# Patient Record
Sex: Male | Born: 1945
Health system: Southern US, Community
[De-identification: ages and names within clinical notes are randomized; demographics above are authoritative.]

## PROBLEM LIST (undated history)

## (undated) DIAGNOSIS — E039 Hypothyroidism, unspecified: Secondary | ICD-10-CM

## (undated) DIAGNOSIS — E785 Hyperlipidemia, unspecified: Secondary | ICD-10-CM

## (undated) DIAGNOSIS — K4020 Bilateral inguinal hernia, without obstruction or gangrene, not specified as recurrent: Secondary | ICD-10-CM

## (undated) DIAGNOSIS — M1990 Unspecified osteoarthritis, unspecified site: Secondary | ICD-10-CM

## (undated) HISTORY — PX: FRACTURE SURGERY: SHX138

## (undated) HISTORY — DX: Bilateral inguinal hernia, without obstruction or gangrene, not specified as recurrent: K40.20

## (undated) HISTORY — PX: OTHER SURGICAL HISTORY: SHX169

## (undated) HISTORY — PX: BACK SURGERY: SHX140

## (undated) HISTORY — DX: Unspecified osteoarthritis, unspecified site: M19.90

## (undated) HISTORY — PX: SPINE SURGERY: SHX786

## (undated) HISTORY — PX: WRIST FRACTURE SURGERY: SHX121

## (undated) HISTORY — DX: Hypothyroidism, unspecified: E03.9

## (undated) HISTORY — DX: Hyperlipidemia, unspecified: E78.5

---

## 2000-10-18 HISTORY — PX: INGUINAL HERNIA REPAIR: SUR1180

## 2009-01-02 ENCOUNTER — Ambulatory Visit (HOSPITAL_COMMUNITY): Admission: RE | Admit: 2009-01-02 | Discharge: 2009-01-02 | Payer: Self-pay | Admitting: Internal Medicine

## 2010-07-25 ENCOUNTER — Encounter: Payer: Self-pay | Admitting: Emergency Medicine

## 2010-07-25 ENCOUNTER — Inpatient Hospital Stay (HOSPITAL_COMMUNITY): Admission: EM | Admit: 2010-07-25 | Discharge: 2010-07-31 | Payer: Self-pay

## 2010-07-25 ENCOUNTER — Ambulatory Visit: Payer: Self-pay | Admitting: Thoracic Surgery (Cardiothoracic Vascular Surgery)

## 2010-08-31 ENCOUNTER — Ambulatory Visit: Payer: Self-pay | Admitting: Surgery

## 2010-08-31 ENCOUNTER — Encounter: Admission: RE | Admit: 2010-08-31 | Discharge: 2010-08-31 | Payer: Self-pay | Admitting: Surgery

## 2010-09-09 ENCOUNTER — Encounter (HOSPITAL_COMMUNITY)
Admission: RE | Admit: 2010-09-09 | Discharge: 2010-10-09 | Payer: Self-pay | Source: Home / Self Care | Attending: Orthopedic Surgery | Admitting: Orthopedic Surgery

## 2010-10-01 ENCOUNTER — Encounter (HOSPITAL_COMMUNITY)
Admission: RE | Admit: 2010-10-01 | Discharge: 2010-10-31 | Payer: Self-pay | Source: Home / Self Care | Attending: Orthopedic Surgery | Admitting: Orthopedic Surgery

## 2010-10-09 ENCOUNTER — Encounter
Admission: RE | Admit: 2010-10-09 | Discharge: 2010-11-08 | Payer: Self-pay | Source: Home / Self Care | Attending: Orthopedic Surgery | Admitting: Orthopedic Surgery

## 2010-11-02 ENCOUNTER — Encounter (HOSPITAL_COMMUNITY)
Admission: RE | Admit: 2010-11-02 | Discharge: 2010-11-17 | Payer: Self-pay | Source: Home / Self Care | Attending: Orthopedic Surgery | Admitting: Orthopedic Surgery

## 2010-11-09 ENCOUNTER — Encounter
Admission: RE | Admit: 2010-11-09 | Discharge: 2010-11-17 | Payer: Self-pay | Source: Home / Self Care | Attending: Orthopedic Surgery | Admitting: Orthopedic Surgery

## 2010-11-20 ENCOUNTER — Ambulatory Visit (HOSPITAL_COMMUNITY): Payer: Self-pay | Admitting: Occupational Therapy

## 2010-12-31 LAB — DIFFERENTIAL
Basophils Absolute: 0 10*3/uL (ref 0.0–0.1)
Basophils Relative: 0 % (ref 0–1)
Eosinophils Absolute: 0.1 10*3/uL (ref 0.0–0.7)
Eosinophils Relative: 1 % (ref 0–5)
Lymphocytes Relative: 12 % (ref 12–46)
Lymphs Abs: 1.5 10*3/uL (ref 0.7–4.0)
Monocytes Absolute: 0.6 10*3/uL (ref 0.1–1.0)
Monocytes Relative: 4 % (ref 3–12)
Neutro Abs: 11.2 10*3/uL — ABNORMAL HIGH (ref 1.7–7.7)
Neutrophils Relative %: 84 % — ABNORMAL HIGH (ref 43–77)

## 2010-12-31 LAB — CBC
HCT: 27.2 % — ABNORMAL LOW (ref 39.0–52.0)
HCT: 27.4 % — ABNORMAL LOW (ref 39.0–52.0)
HCT: 28 % — ABNORMAL LOW (ref 39.0–52.0)
HCT: 29.9 % — ABNORMAL LOW (ref 39.0–52.0)
HCT: 39.5 % (ref 39.0–52.0)
Hemoglobin: 13.3 g/dL (ref 13.0–17.0)
Hemoglobin: 8.8 g/dL — ABNORMAL LOW (ref 13.0–17.0)
Hemoglobin: 9.3 g/dL — ABNORMAL LOW (ref 13.0–17.0)
Hemoglobin: 9.9 g/dL — ABNORMAL LOW (ref 13.0–17.0)
MCH: 29.5 pg (ref 26.0–34.0)
MCH: 29.6 pg (ref 26.0–34.0)
MCH: 30.1 pg (ref 26.0–34.0)
MCHC: 32.4 g/dL (ref 30.0–36.0)
MCHC: 33.1 g/dL (ref 30.0–36.0)
MCHC: 33.2 g/dL (ref 30.0–36.0)
MCHC: 33.6 g/dL (ref 30.0–36.0)
MCV: 88.9 fL (ref 78.0–100.0)
MCV: 89.3 fL (ref 78.0–100.0)
MCV: 89.5 fL (ref 78.0–100.0)
Platelets: 108 10*3/uL — ABNORMAL LOW (ref 150–400)
Platelets: 126 10*3/uL — ABNORMAL LOW (ref 150–400)
Platelets: 191 10*3/uL (ref 150–400)
RBC: 3.1 MIL/uL — ABNORMAL LOW (ref 4.22–5.81)
RBC: 3.15 MIL/uL — ABNORMAL LOW (ref 4.22–5.81)
RBC: 3.35 MIL/uL — ABNORMAL LOW (ref 4.22–5.81)
RBC: 4.42 MIL/uL (ref 4.22–5.81)
RDW: 12.9 % (ref 11.5–15.5)
RDW: 13.2 % (ref 11.5–15.5)
RDW: 13.3 % (ref 11.5–15.5)
RDW: 13.4 % (ref 11.5–15.5)
WBC: 13.4 10*3/uL — ABNORMAL HIGH (ref 4.0–10.5)
WBC: 6.1 10*3/uL (ref 4.0–10.5)
WBC: 6.5 10*3/uL (ref 4.0–10.5)
WBC: 8 10*3/uL (ref 4.0–10.5)
WBC: 8.3 10*3/uL (ref 4.0–10.5)

## 2010-12-31 LAB — URINALYSIS, ROUTINE W REFLEX MICROSCOPIC
Bilirubin Urine: NEGATIVE
Glucose, UA: NEGATIVE mg/dL
Leukocytes, UA: NEGATIVE
Nitrite: NEGATIVE
Protein, ur: 100 mg/dL — AB
Specific Gravity, Urine: 1.025 (ref 1.005–1.030)
Urobilinogen, UA: 0.2 mg/dL (ref 0.0–1.0)
pH: 5 (ref 5.0–8.0)

## 2010-12-31 LAB — BASIC METABOLIC PANEL
BUN: 13 mg/dL (ref 6–23)
BUN: 16 mg/dL (ref 6–23)
BUN: 18 mg/dL (ref 6–23)
CO2: 25 mEq/L (ref 19–32)
CO2: 26 mEq/L (ref 19–32)
Calcium: 8.2 mg/dL — ABNORMAL LOW (ref 8.4–10.5)
Calcium: 8.3 mg/dL — ABNORMAL LOW (ref 8.4–10.5)
Calcium: 9.1 mg/dL (ref 8.4–10.5)
Chloride: 110 mEq/L (ref 96–112)
Chloride: 112 mEq/L (ref 96–112)
Creatinine, Ser: 1.07 mg/dL (ref 0.4–1.5)
Creatinine, Ser: 1.32 mg/dL (ref 0.4–1.5)
GFR calc Af Amer: >60 mL/min (ref >60)
GFR calc Af Amer: >60 mL/min (ref >60)
GFR calc Af Amer: >60 mL/min (ref >60)
GFR calc non Af Amer: 55 mL/min — ABNORMAL LOW (ref >60)
GFR calc non Af Amer: >60 mL/min (ref >60)
GFR calc non Af Amer: >60 mL/min (ref >60)
GFR calc non Af Amer: >60 mL/min (ref >60)
Glucose, Bld: 122 mg/dL — ABNORMAL HIGH (ref 70–99)
Glucose, Bld: 150 mg/dL — ABNORMAL HIGH (ref 70–99)
Glucose, Bld: 167 mg/dL — ABNORMAL HIGH (ref 70–99)
Potassium: 3.5 mEq/L (ref 3.5–5.1)
Potassium: 3.5 mEq/L (ref 3.5–5.1)
Potassium: 3.6 mEq/L (ref 3.5–5.1)
Potassium: 4.4 mEq/L (ref 3.5–5.1)
Sodium: 136 mEq/L (ref 135–145)
Sodium: 138 mEq/L (ref 135–145)
Sodium: 141 mEq/L (ref 135–145)
Sodium: 142 mEq/L (ref 135–145)

## 2010-12-31 LAB — URINE MICROSCOPIC-ADD ON

## 2010-12-31 LAB — TYPE AND SCREEN
ABO/RH(D): O POS
Antibody Screen: NEGATIVE

## 2010-12-31 LAB — APTT: aPTT: 28 seconds (ref 24–37)

## 2010-12-31 LAB — MRSA PCR SCREENING: MRSA by PCR: NEGATIVE

## 2010-12-31 LAB — ABO/RH: ABO/RH(D): O POS

## 2010-12-31 LAB — PROTIME-INR
INR: 1.05 (ref 0.00–1.49)
Prothrombin Time: 13.9 seconds (ref 11.6–15.2)

## 2011-03-02 NOTE — Assessment & Plan Note (Signed)
OFFICE VISIT   Brett Gray, Brett Gray  DOB:  [birth year]                                       08/31/2010  UVOZD#:[id number]   The patient comes in today for followup.  He is status post percutaneous  endovascular repair with aortic transection following a trauma.  He had  a CT scan today that shows complete resolution of his mediastinal  hematoma, the stent graft is in good position.  His access site has  healed.  He has palpable pedal pulses.  I will plan on seeing him back  in 1 year with a repeat CT scan.     Jorge Ny, MD  Electronically Signed   VWB/MEDQ  D:  08/31/2010  T:  09/01/2010  Job:  [id number]

## 2011-05-26 ENCOUNTER — Other Ambulatory Visit: Payer: Self-pay | Admitting: Surgery

## 2011-05-26 DIAGNOSIS — I714 Abdominal aortic aneurysm, without rupture: Secondary | ICD-10-CM

## 2011-07-07 ENCOUNTER — Encounter: Payer: Self-pay | Admitting: Surgery

## 2011-08-14 IMAGING — CR DG CHEST 1V PORT
1 series · 1 of 1 positions shown · non-contrast
Comparison: 07/26/2010

CLINICAL DATA: Thoracic aortic injury.  Status post stent graft
repair.

PORTABLE CHEST - 1 VIEW

[view not recorded]
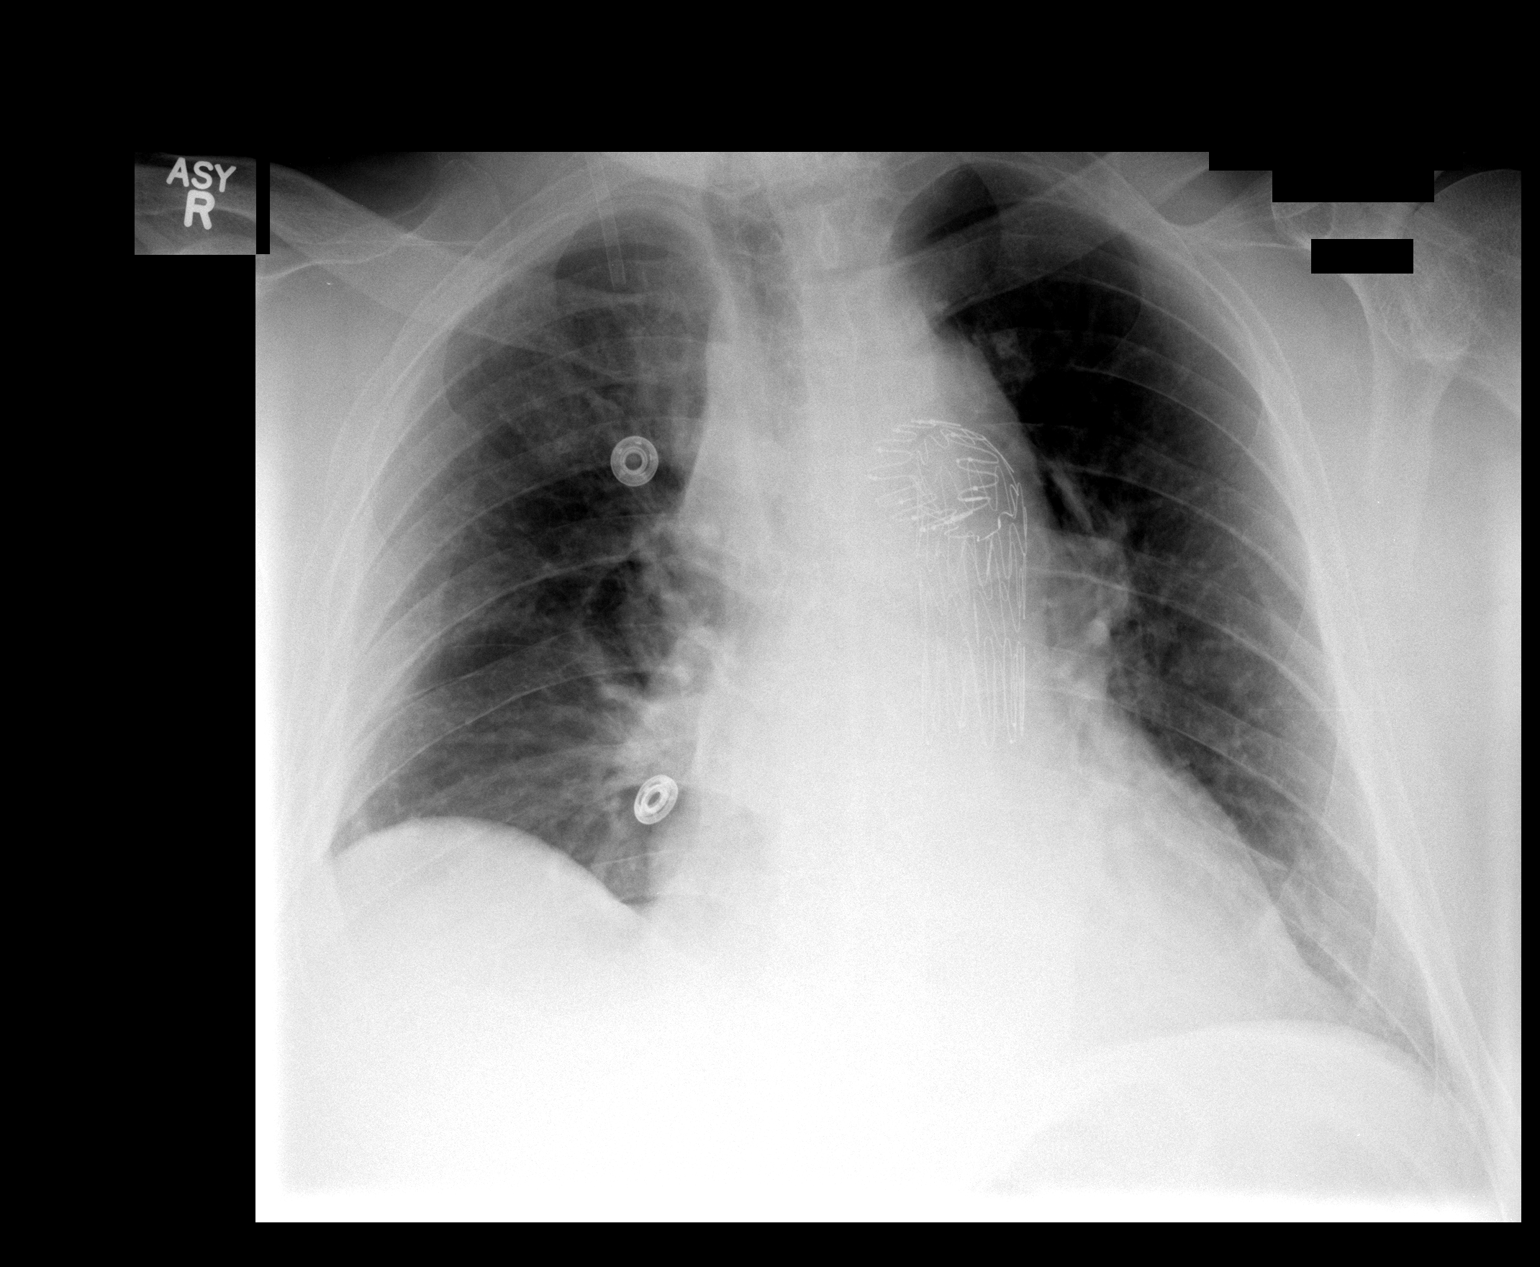

[1 of 1 positions shown; findings below may reference images not displayed]

FINDINGS: The thoracic aortic stent graft is again seen.  Right
jugular Cordis remains in place.

Both lungs are clear.  Mild cardiomegaly is stable.
IMPRESSION: Stable cardiomegaly.  No acute findings.

## 2011-08-27 ENCOUNTER — Encounter: Payer: Self-pay | Admitting: Surgery

## 2011-08-30 ENCOUNTER — Other Ambulatory Visit: Payer: Self-pay | Admitting: Surgery

## 2011-08-30 ENCOUNTER — Ambulatory Visit
Admission: RE | Admit: 2011-08-30 | Discharge: 2011-08-30 | Disposition: A | Discharge status: Home / Self Care | Payer: No Typology Code available for payment source | Source: Ambulatory Visit | Attending: Surgery | Admitting: Surgery

## 2011-08-30 ENCOUNTER — Ambulatory Visit (INDEPENDENT_AMBULATORY_CARE_PROVIDER_SITE_OTHER): Payer: BC Managed Care – PPO | Admitting: Surgery

## 2011-08-30 ENCOUNTER — Encounter: Payer: Self-pay | Admitting: Surgery

## 2011-08-30 VITALS — BP 142/85 | HR 61 | Ht 72.0 in | Wt 222.0 lb

## 2011-08-30 DIAGNOSIS — I714 Abdominal aortic aneurysm, without rupture: Secondary | ICD-10-CM

## 2011-08-30 DIAGNOSIS — I711 Thoracic aortic aneurysm, ruptured, unspecified: Secondary | ICD-10-CM

## 2011-08-30 DIAGNOSIS — I712 Thoracic aortic aneurysm, without rupture, unspecified: Secondary | ICD-10-CM

## 2011-08-30 LAB — CREATININE, SERUM: Creat: 1.1 mg/dL (ref 0.50–1.35)

## 2011-08-30 LAB — BUN: BUN: 18 mg/dL (ref 6–23)

## 2011-08-30 MED ORDER — IOHEXOL 300 MG/ML  SOLN: 100.0000 mL | Freq: Once | INTRAMUSCULAR | Status: AC | PRN

## 2011-08-30 NOTE — Progress Notes (Signed)
[facility] [place]   Patient name: Brett Gray MRN: [id number] DOB: 07/07/1946 Sex: male     Chief Complaint  Patient presents with  . TAA    1 yr f/u    HISTORY OF PRESENT ILLNESS: The patient is back today for followup. He is status post percutaneous endovascular repair of an aortic transection following trauma. The patient had fallen from a ladder and was found to have an aortic transection as well as orthopedic injuries. On October 9 he underwent percutaneous endovascular repair using a [employer] device. He has been doing very well without complaints. He is back to his full functional status.  Past Medical History  Diagnosis Date  . Arthritis   . Hyperlipidemia   . Hernia, inguinal, bilateral     Past Surgical History  Procedure Date  . Aortic catheter     x2  . Back surgery   . Inguinal hernia repair 2002    bilateral    History   Social History  . Marital Status: Married    Spouse Name: N/A    Number of Children: N/A  . Years of Education: N/A   Occupational History  . Not on file.   Social History Main Topics  . Smoking status: Never Smoker   . Smokeless tobacco: Not on file  . Alcohol Use: No  . Drug Use: No  . Sexually Active:    Other Topics Concern  . Not on file   Social History Narrative  . No narrative on file    Family History  Problem Relation Age of Onset  . Other Mother     Carotid artery  . Diabetes Mother   . Cancer Sister     gastric  . Heart disease Brother     Allergies as of 08/30/2011  . (No Known Allergies)    No current outpatient prescriptions on file prior to visit.   No current facility-administered medications on file prior to visit.     REVIEW OF SYSTEMS: Cardiovascular: No chest pain, chest pressure, palpitations, orthopnea, or dyspnea on exertion. No claudication or rest pain,  No history of DVT or phlebitis. Pulmonary: No productive cough, asthma or wheezing. Neurologic:  No weakness, paresthesias, aphasia, or amaurosis. No dizziness. Hematologic: No bleeding problems or clotting disorders. Musculoskeletal: No joint pain or joint swelling. Gastrointestinal: No blood in stool or hematemesis Genitourinary: No dysuria or hematuria. Psychiatric:: No history of major depression. Integumentary: No rashes or ulcers. Constitutional: No fever or chills.  PHYSICAL EXAMINATION:   Vital signs are BP 142/85  Pulse 61  Ht 6' (1.829 m)  Wt 222 lb (100.699 kg)  BMI 30.11 kg/m2  SpO2 98% General: The patient appears their stated age. Pulmonary:  Non labored breathing Abdomen: Soft and non-tender Musculoskeletal: There are no major deformities.  There is no significant extremity pain. Neurologic: No focal weakness or paresthesias are detected, Skin: There are no ulcer or rashes noted. Psychiatric: The patient has normal affect. Cardiovascular: There is a regular rate and rhythm without significant murmur appreciated. Palpable radial pulse bilaterally   Diagnostic Studies I have reviewed his CT angiogram. It has not been formally dictated. I do not see any evidence of endoleak. The stent graft is in good position.  Assessment: Status post endovascular repair of aortic transection Plan: Patient has recovered nicely from his injury I would like to look at this one more time with a CT scan I will do this in 3 years.  Eldridge Abrahams, M.D. [facility] [place] Office: [phone] Pager:  [phone]

## 2012-09-17 ENCOUNTER — Emergency Department (HOSPITAL_COMMUNITY)
Admission: EM | Admit: 2012-09-17 | Discharge: 2012-09-17 | Disposition: A | Discharge status: Home / Self Care | Payer: BC Managed Care – PPO | Attending: Emergency Medicine | Admitting: Emergency Medicine

## 2012-09-17 ENCOUNTER — Encounter (HOSPITAL_COMMUNITY): Payer: Self-pay

## 2012-09-17 ENCOUNTER — Emergency Department (HOSPITAL_COMMUNITY): Payer: BC Managed Care – PPO

## 2012-09-17 DIAGNOSIS — Z79899 Other long term (current) drug therapy: Secondary | ICD-10-CM | POA: Insufficient documentation

## 2012-09-17 DIAGNOSIS — Z7982 Long term (current) use of aspirin: Secondary | ICD-10-CM | POA: Insufficient documentation

## 2012-09-17 DIAGNOSIS — I10 Essential (primary) hypertension: Secondary | ICD-10-CM

## 2012-09-17 DIAGNOSIS — R1013 Epigastric pain: Secondary | ICD-10-CM

## 2012-09-17 DIAGNOSIS — Z8739 Personal history of other diseases of the musculoskeletal system and connective tissue: Secondary | ICD-10-CM | POA: Insufficient documentation

## 2012-09-17 DIAGNOSIS — E785 Hyperlipidemia, unspecified: Secondary | ICD-10-CM | POA: Insufficient documentation

## 2012-09-17 DIAGNOSIS — Z8719 Personal history of other diseases of the digestive system: Secondary | ICD-10-CM | POA: Insufficient documentation

## 2012-09-17 LAB — TROPONIN I: Troponin I: <0.3 ng/mL (ref <0.30)

## 2012-09-17 LAB — CBC WITH DIFFERENTIAL/PLATELET
HCT: 42.9 % (ref 39.0–52.0)
Hemoglobin: 14.4 g/dL (ref 13.0–17.0)
Lymphocytes Relative: 12 % (ref 12–46)
Monocytes Absolute: 0.5 10*3/uL (ref 0.1–1.0)
Monocytes Relative: 7 % (ref 3–12)
Neutro Abs: 6.1 10*3/uL (ref 1.7–7.7)
Neutrophils Relative %: 80 % — ABNORMAL HIGH (ref 43–77)
RBC: 4.78 MIL/uL (ref 4.22–5.81)
WBC: 7.6 10*3/uL (ref 4.0–10.5)

## 2012-09-17 LAB — HEPATIC FUNCTION PANEL
AST: 23 U/L (ref 0–37)
Albumin: 4 g/dL (ref 3.5–5.2)
Alkaline Phosphatase: 83 U/L (ref 39–117)
Total Bilirubin: 0.5 mg/dL (ref 0.3–1.2)
Total Protein: 7.9 g/dL (ref 6.0–8.3)

## 2012-09-17 LAB — BASIC METABOLIC PANEL
BUN: 21 mg/dL (ref 6–23)
CO2: 27 mEq/L (ref 19–32)
Chloride: 100 mEq/L (ref 96–112)
Creatinine, Ser: 1.03 mg/dL (ref 0.50–1.35)
GFR calc Af Amer: 85 mL/min — ABNORMAL LOW (ref >90)
Potassium: 4 mEq/L (ref 3.5–5.1)

## 2012-09-17 MED ORDER — MORPHINE SULFATE 4 MG/ML IJ SOLN
4.0000 mg | Freq: Once | INTRAMUSCULAR | Status: DC
  Filled 2012-09-17: qty 1

## 2012-09-17 MED ORDER — PANTOPRAZOLE SODIUM 40 MG IV SOLR
40.0000 mg | Freq: Once | INTRAVENOUS | Status: AC
  Administered 2012-09-17: 40 mg via INTRAVENOUS
  Filled 2012-09-17: qty 40

## 2012-09-17 MED ORDER — PANTOPRAZOLE SODIUM 40 MG PO TBEC: 40.0000 mg | DELAYED_RELEASE_TABLET | Freq: Every day | ORAL | Status: DC

## 2012-09-17 MED ORDER — GI COCKTAIL ~~LOC~~
30.0000 mL | Freq: Once | ORAL | Status: AC
  Administered 2012-09-17: 30 mL via ORAL
  Filled 2012-09-17: qty 30

## 2012-09-17 NOTE — ED Notes (Signed)
Pt states that the pain has "eased" off, pt and family updated on plan of care and delay

## 2012-09-17 NOTE — ED Notes (Signed)
Pt reports woke up this morning around 0330 with pain in epigastric area.  Says took some pepcid without relief.  Denies any n/v or SOB.  Denies pain radiating anywhere else.  Says has history of stent 2 years ago.

## 2012-09-17 NOTE — ED Provider Notes (Addendum)
History   This chart was scribed for Dione Booze, MD by Toya Smothers, ED Scribe. The patient was seen in room APA19/APA19. Patient's care was started at 0914.  CSN: [id number]  Arrival date & time 09/17/12  4098   First MD Initiated Contact with Patient 09/17/12 [phone]      Chief Complaint  Patient presents with  . Chest Pain   HPI  Brett Gray is a 66 y.o. male followed by Dr. Renette Butters, with h/o Hyperlipidemia who presents to the Emergency Department complaining of 6 hours of sudden onset, unchanged, moderate sternal chest pain described as aching. No alleviator or aggravators. Pain is 7/10 and described similar to a previous episode that occurred 1 week ago. Pt was asymptomatic until this morning when he was awaken by pain. Despite th use of Prilosec and Rolaids, Pt reports no relief. No fever, chills, cough, congestion, rhinorrhea, SOB, or n/v/d. Surgical Hx includes Aortic catheter, back surgery, and Coronary angioplasty with stent.    Past Medical History  Diagnosis Date  . Arthritis   . Hyperlipidemia   . Hernia, inguinal, bilateral     Past Surgical History  Procedure Date  . Aortic catheter     x2  . Back surgery   . Inguinal hernia repair 2002    bilateral  . Coronary angioplasty with stent placement     pt had tear in aorta from a fall    Family History  Problem Relation Age of Onset  . Other Mother     Carotid artery  . Diabetes Mother   . Cancer Sister     gastric  . Heart disease Brother     History  Substance Use Topics  . Smoking status: Never Smoker   . Smokeless tobacco: Not on file  . Alcohol Use: No     Review of Systems  Constitutional: Negative for diaphoresis.  Respiratory: Negative for shortness of breath.   Cardiovascular: Positive for chest pain.  Gastrointestinal: Negative for nausea and vomiting.  All other systems reviewed and are negative.    Allergies  Review of patient's allergies indicates no known allergies.  Home  Medications   Current Outpatient Rx  Name  Route  Sig  Dispense  Refill  . ASPIRIN 81 MG PO TABS   Oral   Take 81 mg by mouth daily.           Marland Kitchen LEVOTHYROXINE SODIUM 100 MCG PO TABS   Oral   Take 1 tablet by mouth daily.          . ADULT MULTIVITAMIN W/MINERALS CH   Oral   Take 1 tablet by mouth daily.         Marland Kitchen NAPROXEN DR 500 MG PO TBEC   Oral   Take 1 tablet by mouth 2 (two) times daily.          Marland Kitchen SIMVASTATIN 40 MG PO TABS   Oral   Take 1 tablet by mouth daily.            BP 173/81  Pulse 64  Temp 97.6 F (36.4 C) (Oral)  Resp 18  Ht 6' (1.829 m)  Wt 225 lb (102.059 kg)  BMI 30.52 kg/m2  SpO2 97%  Physical Exam  Nursing note and vitals reviewed. Constitutional: He appears well-developed and well-nourished.  HENT:  Head: Normocephalic and atraumatic.  Eyes: Conjunctivae normal are normal. Pupils are equal, round, and reactive to light.  Neck: Neck supple. No tracheal deviation present. No thyromegaly  present.  Cardiovascular: Normal rate and regular rhythm.   No murmur heard. Pulmonary/Chest: Effort normal and breath sounds normal.  Abdominal: Soft. Bowel sounds are normal. He exhibits no distension.       Mild epigastric tenderness.  Musculoskeletal: Normal range of motion. He exhibits no edema and no tenderness.  Neurological: He is alert. Coordination normal.  Skin: Skin is warm and dry. No rash noted.  Psychiatric: He has a normal mood and affect.    ED Course  Procedures DIAGNOSTIC STUDIES: Oxygen Saturation is 97% on room air, normal by my interpretation.    COORDINATION OF CARE: 09:26- Ordered DG Chest Portable 1 View 1 time imaging. 09:37- Evaluated Pt. Pt is awake, alert, and without distress.  Results for orders placed during the hospital encounter of 09/17/12  CBC WITH DIFFERENTIAL      Component Value Range   WBC 7.6  4.0 - 10.5 K/uL   RBC 4.78  4.22 - 5.81 MIL/uL   Hemoglobin 14.4  13.0 - 17.0 g/dL   HCT 40.9  81.1 - 91.4  %   MCV 89.7  78.0 - 100.0 fL   MCH 30.1  26.0 - 34.0 pg   MCHC 33.6  30.0 - 36.0 g/dL   RDW 78.2  95.6 - 21.3 %   Platelets 224  150 - 400 K/uL   Neutrophils Relative 80 (*) 43 - 77 %   Neutro Abs 6.1  1.7 - 7.7 K/uL   Lymphocytes Relative 12  12 - 46 %   Lymphs Abs 0.9  0.7 - 4.0 K/uL   Monocytes Relative 7  3 - 12 %   Monocytes Absolute 0.5  0.1 - 1.0 K/uL   Eosinophils Relative 1  0 - 5 %   Eosinophils Absolute 0.1  0.0 - 0.7 K/uL   Basophils Relative 0  0 - 1 %   Basophils Absolute 0.0  0.0 - 0.1 K/uL  BASIC METABOLIC PANEL      Component Value Range   Sodium 136  135 - 145 mEq/L   Potassium 4.0  3.5 - 5.1 mEq/L   Chloride 100  96 - 112 mEq/L   CO2 27  19 - 32 mEq/L   Glucose, Bld 170 (*) 70 - 99 mg/dL   BUN 21  6 - 23 mg/dL   Creatinine, Ser 0.86  0.50 - 1.35 mg/dL   Calcium 9.4  8.4 - 57.8 mg/dL   GFR calc non Af Amer 74 (*) >90 mL/min   GFR calc Af Amer 85 (*) >90 mL/min  TROPONIN I      Component Value Range   Troponin I <0.30  <0.30 ng/mL  LIPASE, BLOOD      Component Value Range   Lipase 33  11 - 59 U/L  HEPATIC FUNCTION PANEL      Component Value Range   Total Protein 7.9  6.0 - 8.3 g/dL   Albumin 4.0  3.5 - 5.2 g/dL   AST 23  0 - 37 U/L   ALT 13  0 - 53 U/L   Alkaline Phosphatase 83  39 - 117 U/L   Total Bilirubin 0.5  0.3 - 1.2 mg/dL   Bilirubin, Direct 0.1  0.0 - 0.3 mg/dL   Indirect Bilirubin 0.4  0.3 - 0.9 mg/dL   Dg Chest Portable 1 View  09/17/2012  *RADIOLOGY REPORT*  Clinical Data: Chest pain  PORTABLE CHEST - 1 VIEW  Comparison: 07/27/2010  Findings: Thoracic aortic stent graft is unchanged in  position beginning at the aortic arch extending into the thoracic aorta.  Lungs are clear.  Negative for heart failure.  No infiltrate or effusion.  IMPRESSION: Aortic stent graft is unchanged.  No acute cardiopulmonary disease.   Original Report Authenticated By: Janeece Riggers, M.D.     Date: 09/17/2012  Rate: 64  Rhythm: normal sinus rhythm and sinus  arrhythmia  QRS Axis: normal  Intervals: normal  ST/T Wave abnormalities: normal  Conduction Disutrbances:none  Narrative Interpretation: Normal sinus rhythm with sinus arrhythmia. When compared with ECG of 07/25/2010, there has been improvement in nonspecific T wave changes.  Old EKG Reviewed: changes noted     1. Epigastric pain   2. Hypertension       MDM  Pain which she seems to be more epigastric than chest pain. He'll be given a GI cocktail and reassessed. Laboratory workup has been initiated.  Laboratory workup is significant for mild hyperglycemia. He got moderate relief with a GI cocktail with pain decreasing to 4/10. He is given a dose of pantoprazole and his dose of morphine. He will be discharged with a prescription for pantoprazole. He is to followup with his PCP in 2 weeks. Consideration should be given to GI consultation.   I personally performed the services described in this documentation, which was scribed in my presence. The recorded information has been reviewed and is accurate.      Dione Booze, MD 09/17/12 1126  Dione Booze, MD 09/17/12 1134

## 2012-09-17 NOTE — ED Notes (Signed)
Assisted pt up to restroom

## 2012-09-17 NOTE — ED Notes (Signed)
Dr. Preston Fleeting in room talking with pt and family

## 2012-09-17 NOTE — ED Notes (Signed)
Pt c/o epigastric pain that is non radiating that started around 3:30 this am, denies any n/v/sob/diaphorisis. Ekg performed on pt arrival to er.

## 2012-09-18 ENCOUNTER — Inpatient Hospital Stay (HOSPITAL_COMMUNITY)
Admission: EM | Admit: 2012-09-18 | Discharge: 2012-09-21 | DRG: 494 | Disposition: A | Discharge status: Home / Self Care | Payer: BC Managed Care – PPO | Attending: General Surgery | Admitting: General Surgery

## 2012-09-18 ENCOUNTER — Emergency Department (HOSPITAL_COMMUNITY): Payer: BC Managed Care – PPO

## 2012-09-18 ENCOUNTER — Encounter (HOSPITAL_COMMUNITY): Payer: Self-pay | Admitting: *Deleted

## 2012-09-18 DIAGNOSIS — Z791 Long term (current) use of non-steroidal anti-inflammatories (NSAID): Secondary | ICD-10-CM

## 2012-09-18 DIAGNOSIS — M129 Arthropathy, unspecified: Secondary | ICD-10-CM | POA: Diagnosis present

## 2012-09-18 DIAGNOSIS — K810 Acute cholecystitis: Secondary | ICD-10-CM

## 2012-09-18 DIAGNOSIS — Z7982 Long term (current) use of aspirin: Secondary | ICD-10-CM

## 2012-09-18 DIAGNOSIS — Z9861 Coronary angioplasty status: Secondary | ICD-10-CM

## 2012-09-18 DIAGNOSIS — E785 Hyperlipidemia, unspecified: Secondary | ICD-10-CM | POA: Diagnosis present

## 2012-09-18 DIAGNOSIS — K8000 Calculus of gallbladder with acute cholecystitis without obstruction: Principal | ICD-10-CM | POA: Diagnosis present

## 2012-09-18 LAB — CBC WITH DIFFERENTIAL/PLATELET
Basophils Absolute: 0 10*3/uL (ref 0.0–0.1)
Basophils Relative: 0 % (ref 0–1)
Eosinophils Absolute: 0 10*3/uL (ref 0.0–0.7)
HCT: 41.5 % (ref 39.0–52.0)
MCHC: 34.2 g/dL (ref 30.0–36.0)
Monocytes Absolute: 1.9 10*3/uL — ABNORMAL HIGH (ref 0.1–1.0)
Neutro Abs: 14.5 10*3/uL — ABNORMAL HIGH (ref 1.7–7.7)
RDW: 12.4 % (ref 11.5–15.5)

## 2012-09-18 LAB — COMPREHENSIVE METABOLIC PANEL
AST: 19 U/L (ref 0–37)
Albumin: 3.4 g/dL — ABNORMAL LOW (ref 3.5–5.2)
Calcium: 9.3 mg/dL (ref 8.4–10.5)
Chloride: 101 mEq/L (ref 96–112)
Creatinine, Ser: 1.21 mg/dL (ref 0.50–1.35)
Total Protein: 7.3 g/dL (ref 6.0–8.3)

## 2012-09-18 MED ORDER — SODIUM CHLORIDE 0.9 % IV SOLN
Freq: Once | INTRAVENOUS | Status: AC
  Administered 2012-09-18: 22:00:00 via INTRAVENOUS

## 2012-09-18 MED ORDER — ONDANSETRON HCL 4 MG/2ML IJ SOLN
INTRAMUSCULAR | Status: AC
  Administered 2012-09-19: 4 mg via INTRAVENOUS
  Filled 2012-09-18: qty 2

## 2012-09-18 MED ORDER — ONDANSETRON HCL 4 MG/2ML IJ SOLN
4.0000 mg | Freq: Once | INTRAMUSCULAR | Status: AC
  Administered 2012-09-18: 4 mg via INTRAVENOUS

## 2012-09-18 MED ORDER — HYDROMORPHONE HCL PF 1 MG/ML IJ SOLN
1.0000 mg | Freq: Once | INTRAMUSCULAR | Status: AC
  Administered 2012-09-18: 1 mg via INTRAVENOUS
  Filled 2012-09-18: qty 1

## 2012-09-18 MED ORDER — HYDROMORPHONE HCL PF 1 MG/ML IJ SOLN
1.0000 mg | Freq: Once | INTRAMUSCULAR | Status: AC
  Administered 2012-09-18: 1 mg via INTRAVENOUS

## 2012-09-18 MED ORDER — GI COCKTAIL ~~LOC~~
30.0000 mL | Freq: Once | ORAL | Status: DC
  Filled 2012-09-18: qty 30

## 2012-09-18 MED ORDER — HYDROMORPHONE HCL PF 1 MG/ML IJ SOLN
INTRAMUSCULAR | Status: AC
  Filled 2012-09-18: qty 1

## 2012-09-18 NOTE — ED Notes (Signed)
RUQ pain, NV d,   Seen here yesterday for same sx,  Seen by Dr Phillips Odor, today and told may be gallbladder.  Marland Kitchen

## 2012-09-18 NOTE — ED Notes (Signed)
States he is having pain in her right upper abdominal quadrant along with intermittent nausea, vomiting and diarrhea.  States he is having pain in his right shoulder as well.  Rates his pain 10/10.

## 2012-09-18 NOTE — ED Provider Notes (Addendum)
Pt presents with ongoing RUQ pain over last day occasional radiation to the R shoulder - increasing, has had n and one episode of V, and on my exam has ttp in the RUQ and less so in the epigastrium.  No f/c/cough/sob.  Was w/u yesterday in ED, no acute findings, today WBC has increased.  Normal heart adn lungs, no edema, no rashes, continue w/u for GI source / chole etc.  Improvement with dilaudid at this point.  4:00 AM The CT scan has been read, shows acute cholecystitis, I have discussed this with general surgeon Dr. Lovell Sheehan who agrees to admit the patient, holding orders written. Patient stable, Unasyn ordered.  I have reexamined the abd - no peritoneal signs at this time, ongoing RUQ ttp with mild guarding  Medical screening examination/treatment/procedure(s) were conducted as a shared visit with non-physician practitioner(s) and myself.  I personally evaluated the patient during the encounter     Vida Roller, MD 09/18/12 2351  Vida Roller, MD 09/18/12 2351  Vida Roller, MD 09/19/12 [phone]

## 2012-09-18 NOTE — ED Notes (Signed)
Patient states his pain is easing a little at this time.

## 2012-09-19 ENCOUNTER — Encounter (HOSPITAL_COMMUNITY): Payer: Self-pay | Admitting: *Deleted

## 2012-09-19 LAB — SURGICAL PCR SCREEN: MRSA, PCR: NEGATIVE

## 2012-09-19 MED ORDER — LACTATED RINGERS IV SOLN
INTRAVENOUS | Status: DC
  Administered 2012-09-19 – 2012-09-20 (×4): via INTRAVENOUS

## 2012-09-19 MED ORDER — DIPHENHYDRAMINE HCL 12.5 MG/5ML PO ELIX: 12.5000 mg | ORAL_SOLUTION | Freq: Four times a day (QID) | ORAL | Status: DC | PRN

## 2012-09-19 MED ORDER — CHLORHEXIDINE GLUCONATE 4 % EX LIQD
1.0000 "application " | Freq: Once | CUTANEOUS | Status: AC
  Administered 2012-09-19: 1 via TOPICAL
  Filled 2012-09-19: qty 15

## 2012-09-19 MED ORDER — ENOXAPARIN SODIUM 40 MG/0.4ML ~~LOC~~ SOLN
40.0000 mg | SUBCUTANEOUS | Status: DC
  Administered 2012-09-19 – 2012-09-20 (×2): 40 mg via SUBCUTANEOUS
  Filled 2012-09-19 (×2): qty 0.4

## 2012-09-19 MED ORDER — ONDANSETRON HCL 4 MG/2ML IJ SOLN: 4.0000 mg | Freq: Three times a day (TID) | INTRAMUSCULAR | Status: DC | PRN

## 2012-09-19 MED ORDER — SODIUM CHLORIDE 0.9 % IV SOLN
INTRAVENOUS | Status: AC
  Administered 2012-09-19: 06:00:00 via INTRAVENOUS

## 2012-09-19 MED ORDER — DIPHENHYDRAMINE HCL 50 MG/ML IJ SOLN: 12.5000 mg | Freq: Four times a day (QID) | INTRAMUSCULAR | Status: DC | PRN

## 2012-09-19 MED ORDER — ACETAMINOPHEN 650 MG RE SUPP: 650.0000 mg | Freq: Four times a day (QID) | RECTAL | Status: DC | PRN

## 2012-09-19 MED ORDER — HYDROMORPHONE HCL PF 1 MG/ML IJ SOLN
1.0000 mg | INTRAMUSCULAR | Status: DC | PRN
  Administered 2012-09-19: 1 mg via INTRAVENOUS

## 2012-09-19 MED ORDER — INFLUENZA VIRUS VACC SPLIT PF IM SUSP
0.5000 mL | INTRAMUSCULAR | Status: DC
  Filled 2012-09-19: qty 0.5

## 2012-09-19 MED ORDER — SODIUM CHLORIDE 0.9 % IV SOLN: INTRAVENOUS | Status: DC

## 2012-09-19 MED ORDER — PANTOPRAZOLE SODIUM 40 MG IV SOLR
40.0000 mg | Freq: Every day | INTRAVENOUS | Status: DC
  Administered 2012-09-19: 40 mg via INTRAVENOUS
  Filled 2012-09-19: qty 40

## 2012-09-19 MED ORDER — PROMETHAZINE HCL 25 MG/ML IJ SOLN
25.0000 mg | Freq: Once | INTRAMUSCULAR | Status: AC
  Administered 2012-09-19: 25 mg via INTRAVENOUS

## 2012-09-19 MED ORDER — ONDANSETRON HCL 4 MG/2ML IJ SOLN
INTRAMUSCULAR | Status: AC
  Administered 2012-09-19: 2 mg via INTRAMUSCULAR
  Filled 2012-09-19: qty 2

## 2012-09-19 MED ORDER — INFLUENZA VIRUS VACC SPLIT PF IM SUSP
0.5000 mL | Freq: Once | INTRAMUSCULAR | Status: DC
  Administered 2012-09-19: 0.5 mL via INTRAMUSCULAR
  Filled 2012-09-19: qty 0.5

## 2012-09-19 MED ORDER — HYDROMORPHONE HCL PF 1 MG/ML IJ SOLN
1.0000 mg | INTRAMUSCULAR | Status: DC | PRN
  Administered 2012-09-19 – 2012-09-20 (×7): 1 mg via INTRAVENOUS
  Filled 2012-09-19 (×8): qty 1

## 2012-09-19 MED ORDER — SODIUM CHLORIDE 0.9 % IV SOLN
3.0000 g | Freq: Once | INTRAVENOUS | Status: AC
  Administered 2012-09-19: 3 g via INTRAVENOUS
  Filled 2012-09-19: qty 3

## 2012-09-19 MED ORDER — OXYCODONE HCL 5 MG PO TABS
5.0000 mg | ORAL_TABLET | ORAL | Status: DC | PRN
  Administered 2012-09-19: 5 mg via ORAL
  Filled 2012-09-19: qty 1

## 2012-09-19 MED ORDER — ONDANSETRON HCL 4 MG/2ML IJ SOLN: 4.0000 mg | Freq: Four times a day (QID) | INTRAMUSCULAR | Status: DC | PRN

## 2012-09-19 MED ORDER — ACETAMINOPHEN 325 MG PO TABS: 650.0000 mg | ORAL_TABLET | Freq: Four times a day (QID) | ORAL | Status: DC | PRN

## 2012-09-19 MED ORDER — ONDANSETRON HCL 4 MG/2ML IJ SOLN: 4.0000 mg | Freq: Once | INTRAMUSCULAR | Status: DC

## 2012-09-19 MED ORDER — PROMETHAZINE HCL 25 MG/ML IJ SOLN
INTRAMUSCULAR | Status: AC
  Filled 2012-09-19: qty 1

## 2012-09-19 MED ORDER — SODIUM CHLORIDE 0.9 % IV SOLN
3.0000 g | Freq: Four times a day (QID) | INTRAVENOUS | Status: DC
  Administered 2012-09-19 – 2012-09-20 (×5): 3 g via INTRAVENOUS
  Filled 2012-09-19 (×9): qty 3

## 2012-09-19 MED ORDER — IOHEXOL 300 MG/ML  SOLN
100.0000 mL | Freq: Once | INTRAMUSCULAR | Status: AC | PRN
  Administered 2012-09-19: 100 mL via INTRAVENOUS

## 2012-09-19 MED ORDER — HYDROMORPHONE HCL PF 1 MG/ML IJ SOLN
1.0000 mg | INTRAMUSCULAR | Status: DC | PRN
  Administered 2012-09-19: 1 mg via INTRAVENOUS
  Filled 2012-09-19 (×3): qty 1

## 2012-09-19 MED ORDER — LEVOTHYROXINE SODIUM 100 MCG PO TABS
100.0000 ug | ORAL_TABLET | Freq: Every day | ORAL | Status: DC
  Administered 2012-09-19 – 2012-09-21 (×3): 100 ug via ORAL
  Filled 2012-09-19 (×3): qty 1

## 2012-09-19 NOTE — ED Provider Notes (Signed)
   Medical screening examination/treatment/procedure(s) were conducted as a shared visit with non-physician practitioner(s) and myself.  I personally evaluated the patient during the encounter  Please see my separate respective documentation pertaining to this patient encounter   Vida Roller, MD 09/19/12 [phone]

## 2012-09-19 NOTE — Care Management Note (Signed)
    Page 1 of 1   09/21/2012     9:24:47 AM   CARE MANAGEMENT NOTE 09/21/2012  Patient:  LUZ, BURCHER   Account Number:  [id number]  Date Initiated:  09/19/2012  Documentation initiated by:  Sharrie Rothman  Subjective/Objective Assessment:   Pt admitted from home with acute cholecystitis. Pt lives with his wife and will return home at discharge.Pt is indpendent with ADL's.     Action/Plan:   No CM or HH needs noted.   Anticipated DC Date:  09/21/2012   Anticipated DC Plan:  HOME/SELF CARE      DC Planning Services  CM consult      Choice offered to / List presented to:             Status of service:  Completed, signed off Medicare Important Message given?   (If response is "NO", the following Medicare IM given date fields will be blank) Date Medicare IM given:   Date Additional Medicare IM given:    Discharge Disposition:  HOME/SELF CARE  Per UR Regulation:    If discussed at Long Length of Stay Meetings, dates discussed:    Comments:  09/21/12 0925 Arlyss Queen, RN BSN CM Pt discharged home today. No CM or HH needs noted.  09/19/12 1400 Arlyss Queen, RN BSN CM

## 2012-09-19 NOTE — Progress Notes (Signed)
UR chart review completed.  

## 2012-09-19 NOTE — H&P (Signed)
Brett Gray is an 66 y.o. male.   Chief Complaint: Upper abdominal pain HPI: Patient is a 66 year old white male who presented emergency room early this morning with worsening right upper quadrant abdominal pain, nausea, vomiting. He had visited the emergency room 24 hours earlier. Cardiac workup was unremarkable. He states he has had several episodes of right upper quadrant abdominal pain with nausea and vomiting. A CT scan of the abdomen was performed which revealed acute cholecystitis with significant inflammatory reaction in the right upper quadrant. His liver enzyme tests are within normal limits. His white blood cell count was 17,000, which was significantly elevated from a normal value 24 hours earlier. He has had problems with fatty food intolerance the past. He has had multiple episodes of upper abdominal pain prior to this admission.  Past Medical History  Diagnosis Date  . Arthritis   . Hyperlipidemia   . Hernia, inguinal, bilateral     Past Surgical History  Procedure Date  . Aortic catheter     x2  . Back surgery   . Inguinal hernia repair 2002    bilateral  . Coronary angioplasty with stent placement     pt had tear in aorta from a fall  . Aorta tear     Family History  Problem Relation Age of Onset  . Other Mother     Carotid artery  . Diabetes Mother   . Cancer Sister     gastric  . Heart disease Brother    Social History:  reports that he has never smoked. He does not have any smokeless tobacco history on file. He reports that he does not drink alcohol or use illicit drugs.  Allergies: No Known Allergies  Medications Prior to Admission  Medication Sig Dispense Refill  . aspirin 81 MG tablet Take 81 mg by mouth daily.        Marland Kitchen levothyroxine (SYNTHROID, LEVOTHROID) 100 MCG tablet Take 1 tablet by mouth daily.       . Multiple Vitamin (MULTIVITAMIN WITH MINERALS) TABS Take 1 tablet by mouth daily.      Marland Kitchen NAPROXEN DR 500 MG EC tablet Take 1 tablet by mouth 2  (two) times daily.       . pantoprazole (PROTONIX) 40 MG tablet Take 1 tablet (40 mg total) by mouth daily.  20 tablet  0  . simvastatin (ZOCOR) 40 MG tablet Take 1 tablet by mouth daily.         Results for orders placed during the hospital encounter of 09/18/12 (from the past 48 hour(s))  CBC WITH DIFFERENTIAL     Status: Abnormal   Collection Time   09/18/12 10:21 PM      Component Value Range Comment   WBC 17.0 (*) 4.0 - 10.5 K/uL    RBC 4.73  4.22 - 5.81 MIL/uL    Hemoglobin 14.2  13.0 - 17.0 g/dL    HCT 16.1  09.6 - 04.5 %    MCV 87.7  78.0 - 100.0 fL    MCH 30.0  26.0 - 34.0 pg    MCHC 34.2  30.0 - 36.0 g/dL    RDW 40.9  81.1 - 91.4 %    Platelets 198  150 - 400 K/uL    Neutrophils Relative 85 (*) 43 - 77 %    Neutro Abs 14.5 (*) 1.7 - 7.7 K/uL    Lymphocytes Relative 3 (*) 12 - 46 %    Lymphs Abs 0.6 (*) 0.7 - 4.0  K/uL    Monocytes Relative 11  3 - 12 %    Monocytes Absolute 1.9 (*) 0.1 - 1.0 K/uL    Eosinophils Relative 0  0 - 5 %    Eosinophils Absolute 0.0  0.0 - 0.7 K/uL    Basophils Relative 0  0 - 1 %    Basophils Absolute 0.0  0.0 - 0.1 K/uL   COMPREHENSIVE METABOLIC PANEL     Status: Abnormal   Collection Time   09/18/12 10:21 PM      Component Value Range Comment   Sodium 137  135 - 145 mEq/L    Potassium 4.2  3.5 - 5.1 mEq/L    Chloride 101  96 - 112 mEq/L    CO2 24  19 - 32 mEq/L    Glucose, Bld 141 (*) 70 - 99 mg/dL    BUN 19  6 - 23 mg/dL    Creatinine, Ser 1.61  0.50 - 1.35 mg/dL    Calcium 9.3  8.4 - 09.6 mg/dL    Total Protein 7.3  6.0 - 8.3 g/dL    Albumin 3.4 (*) 3.5 - 5.2 g/dL    AST 19  0 - 37 U/L    ALT 12  0 - 53 U/L    Alkaline Phosphatase 73  39 - 117 U/L    Total Bilirubin 0.6  0.3 - 1.2 mg/dL    GFR calc non Af Amer 61 (*) >90 mL/min    GFR calc Af Amer 70 (*) >90 mL/min   LIPASE, BLOOD     Status: Normal   Collection Time   09/18/12 10:21 PM      Component Value Range Comment   Lipase 17  11 - 59 U/L    Ct Abdomen Pelvis W  Contrast  09/19/2012  *RADIOLOGY REPORT*  Clinical Data: Vomiting; upper quadrant abdominal pain and nausea. Diarrhea.  Leukocytosis.  CT ABDOMEN AND PELVIS WITH CONTRAST  Technique:  Multidetector CT imaging of the abdomen and pelvis was performed following the standard protocol during bolus administration of intravenous contrast.  Contrast: OMNIPAQUE IOHEXOL 300 MG/ML  SOLN  Comparison: CTA of the abdomen and pelvis performed 08/31/2010  Findings: The visualized lung bases are clear.  There is prominent thickening of the gallbladder wall, with associated pericholecystic fluid and significant associated soft tissue inflammation.  Soft tissue inflammation tracks inferiorly about the hepatic flexure of the colon, with significant bowel inflammation also seen.  Inflammation extends inferiorly along Gerota's fascia on the right.  There is mild soft tissue inflammation about the second segment of the duodenum. Findings are compatible with acute cholecystitis; there is no evidence of dilatation of the biliary ducts to suggest obstruction.  A tiny amount of free fluid is noted about the liver, and within the pelvis.  Nonspecific perinephric stranding is noted bilaterally.  A 2.1 cm hypodensity near the upper pole of the left kidney reflects a known parapelvic cyst. The kidneys are otherwise unremarkable in appearance.  There is no evidence of hydronephrosis.  No renal or ureteral stones are seen.  There is diffuse fatty infiltration within the wall of the second segment of the duodenum, possibly reflecting chronic inflammation; a small amount of fluid within the second segment of the duodenum remains within normal limits.  The stomach is filled with contrast, but it has not yet progressed past the pylorus.  No acute vascular abnormalities are seen.  Scattered calcification is noted along the abdominal aorta and its branches.  The appendix is normal in caliber, without evidence for appendicitis.  Diffuse fatty  infiltration within the wall of the ascending and proximal transverse colon may reflect chronic inflammation.  The bladder is mildly distended and grossly unremarkable.  The prostate remains normal in size.  No inguinal lymphadenopathy is seen.  No acute osseous abnormalities are identified.  Vacuum phenomenon and disc space narrowing are noted at L4-L5.  IMPRESSION:  1.  Acute cholecystitis, with prominent thickening of the gallbladder wall, associated pericholecystic fluid and significant soft tissue inflammation.  Evaluation for stones is limited on CT. No evidence for distal obstruction. 2.  Associated significant soft tissue inflammation tracking about the hepatic flexure of the colon, with associated bowel inflammation, and to a lesser extent about the second segment of the duodenum.  Soft tissue stranding extends along Gerota's fascia on the right side. 3.  Tiny amount of free fluid noted about the liver, and within the pelvis. 4.  Diffuse fatty infiltration within the wall of the second segment of the may reflect chronic inflammation; there is similar fatty infiltration within the wall of the ascending and proximal transverse colon. 5.  The stomach is filled with contrast, but it has not yet progressed past the pylorus.  This may reflect gastroparesis secondary to the inflammatory gallbladder process. 6.  Left renal cyst again noted. 7.  Scattered calcification along the abdominal aorta and its branches.   Original Report Authenticated By: Tonia Ghent, M.D.     Review of Systems  Constitutional: Positive for fever and malaise/fatigue.  HENT: Negative.   Eyes: Negative.   Respiratory: Negative.   Cardiovascular: Negative.   Gastrointestinal: Positive for heartburn, nausea and abdominal pain.  Genitourinary: Negative.   Musculoskeletal: Negative.   Skin: Negative.   Neurological: Positive for weakness.  Endo/Heme/Allergies: Negative.     Blood pressure 123/71, pulse 91, temperature 97.6 F  (36.4 C), temperature source Oral, resp. rate 20, height 6' (1.829 m), weight 100.2 kg (220 lb 14.4 oz), SpO2 96.00%. Physical Exam  Constitutional: He is oriented to person, place, and time. He appears well-developed and well-nourished.  HENT:  Head: Normocephalic and atraumatic.  Eyes: No scleral icterus.  Neck: Normal range of motion. Neck supple.  Cardiovascular: Normal rate, regular rhythm and normal heart sounds.   Respiratory: Effort normal and breath sounds normal.  GI: Soft. He exhibits distension and mass. There is tenderness. There is rebound.       Fullness noted in right upper quadrant, tender to palpation.  Neurological: He is alert and oriented to person, place, and time.  Skin: Skin is warm and dry.  Psychiatric: He has a normal mood and affect. His behavior is normal.     Assessment/Plan Impression: Acute cholecystitis Plan: The patient admitted to the hospital for intravenous hydration, control of his pain, and IV antibiotics. He was subsequently undergo a laparoscopic cholecystectomy, possible open. The risks and benefits of the procedure including bleeding, infection, hepatobiliary injury, and the possibility of an open procedure were fully explained to the patient, who gave informed consent.  Willona Phariss A 09/19/2012, 10:10 AM

## 2012-09-19 NOTE — ED Notes (Signed)
Pt resting quietly, no complaints of pain offered, no nausea, no vomiting.

## 2012-09-19 NOTE — ED Provider Notes (Signed)
History     CSN: [id number]  Arrival date & time 09/18/12  1949   First MD Initiated Contact with Patient 09/18/12 2236      Chief Complaint  Patient presents with  . Abdominal Pain    (Consider location/radiation/quality/duration/timing/severity/associated sxs/prior treatment) HPI Comments: Brett Gray presents for evaluation of upper abdominal pain which has been present intermittently for the past 7-10 days,  But worsened over the past 24 hours.  He was seen here yesterday at which time his pain was higher in his midsternal area and his chest pain work up was normal.  He was seen by Dr Phillips Odor today who felt his pain may be related to gallbladder disease.  Patient presents for further evaluation since his pain intensified around 6 pm tonight,  Described as sharp and occasionally radiating into his right shoulder.  He has a decreased appetite for the past week and has had mild diarrhea,  Describing loosely formed stools 3-4 times daily for the past week.  He has persistent nausea with one episode of non bloody emesis yesterday.  He denies cough, shortness of breath and chest pain today.  He does have intermittent chills but no documented fevers.  He has taken antacids without relief of symptoms.  The history is provided by the patient and the spouse.    Past Medical History  Diagnosis Date  . Arthritis   . Hyperlipidemia   . Hernia, inguinal, bilateral     Past Surgical History  Procedure Date  . Aortic catheter     x2  . Back surgery   . Inguinal hernia repair 2002    bilateral  . Coronary angioplasty with stent placement     pt had tear in aorta from a fall  . Aorta tear     Family History  Problem Relation Age of Onset  . Other Mother     Carotid artery  . Diabetes Mother   . Cancer Sister     gastric  . Heart disease Brother     History  Substance Use Topics  . Smoking status: Never Smoker   . Smokeless tobacco: Not on file  . Alcohol Use: No       Review of Systems  Constitutional: Positive for chills. Negative for fever.  HENT: Negative for congestion, sore throat and neck pain.   Eyes: Negative.   Respiratory: Negative for chest tightness and shortness of breath.   Cardiovascular: Negative for chest pain.  Gastrointestinal: Positive for nausea, vomiting, abdominal pain and diarrhea.  Genitourinary: Negative.   Musculoskeletal: Negative for joint swelling and arthralgias.  Skin: Negative.  Negative for rash and wound.  Neurological: Negative for dizziness, weakness, light-headedness, numbness and headaches.  Hematological: Negative.   Psychiatric/Behavioral: Negative.     Allergies  Review of patient's allergies indicates no known allergies.  Home Medications   Current Outpatient Rx  Name  Route  Sig  Dispense  Refill  . ASPIRIN 81 MG PO TABS   Oral   Take 81 mg by mouth daily.           Marland Kitchen LEVOTHYROXINE SODIUM 100 MCG PO TABS   Oral   Take 1 tablet by mouth daily.          . ADULT MULTIVITAMIN W/MINERALS CH   Oral   Take 1 tablet by mouth daily.         Marland Kitchen NAPROXEN DR 500 MG PO TBEC   Oral   Take 1 tablet by mouth 2 (  two) times daily.          Marland Kitchen PANTOPRAZOLE SODIUM 40 MG PO TBEC   Oral   Take 1 tablet (40 mg total) by mouth daily.   20 tablet   0   . SIMVASTATIN 40 MG PO TABS   Oral   Take 1 tablet by mouth daily.            BP 147/74  Pulse 88  Temp 97.9 F (36.6 C) (Oral)  Resp 20  Ht 6' (1.829 m)  Wt 225 lb (102.059 kg)  BMI 30.52 kg/m2  SpO2 96%  Physical Exam  Nursing note and vitals reviewed. Constitutional: He appears well-developed and well-nourished.  HENT:  Head: Normocephalic and atraumatic.  Eyes: Conjunctivae normal are normal.  Neck: Normal range of motion.  Cardiovascular: Normal rate, regular rhythm, normal heart sounds and intact distal pulses.   Pulmonary/Chest: Effort normal and breath sounds normal. No respiratory distress.  Abdominal: Soft. Bowel  sounds are normal. He exhibits no distension. There is no hepatosplenomegaly. There is tenderness in the right upper quadrant, epigastric area and left upper quadrant. There is no rigidity, no rebound, no guarding and no CVA tenderness.  Musculoskeletal: Normal range of motion.  Neurological: He is alert.  Skin: Skin is warm and dry.  Psychiatric: He has a normal mood and affect.    ED Course  Procedures (including critical care time)  Labs Reviewed  CBC WITH DIFFERENTIAL - Abnormal; Notable for the following:    WBC 17.0 (*)     Neutrophils Relative 85 (*)     Neutro Abs 14.5 (*)     Lymphocytes Relative 3 (*)     Lymphs Abs 0.6 (*)     Monocytes Absolute 1.9 (*)     All other components within normal limits  COMPREHENSIVE METABOLIC PANEL - Abnormal; Notable for the following:    Glucose, Bld 141 (*)     Albumin 3.4 (*)     GFR calc non Af Amer 61 (*)     GFR calc Af Amer 70 (*)     All other components within normal limits  LIPASE, BLOOD   Dg Chest Portable 1 View  09/17/2012  *RADIOLOGY REPORT*  Clinical Data: Chest pain  PORTABLE CHEST - 1 VIEW  Comparison: 07/27/2010  Findings: Thoracic aortic stent graft is unchanged in position beginning at the aortic arch extending into the thoracic aorta.  Lungs are clear.  Negative for heart failure.  No infiltrate or effusion.  IMPRESSION: Aortic stent graft is unchanged.  No acute cardiopulmonary disease.   Original Report Authenticated By: Janeece Riggers, M.D.      No diagnosis found.    MDM  Wbc significantly elevated compared to yesterdays labs with other lab tests normal range.  Pt has obtained pain relief with IV dilaudid, nausea better with zofran.  He will be sent for Ct scan of abd/pelvis,  Has vomited oral contrast x 2 despite repeat zofran,  Than trial of phenergan.    Pt seen by Dr Hyacinth Meeker who has seen pt and will dispo pending Ct results.   Date: 09/19/2012  Rate: 82  Rhythm: normal sinus rhythm  QRS Axis: normal   Intervals: normal  ST/T Wave abnormalities: normal  Conduction Disutrbances:none  Narrative Interpretation:   Old EKG Reviewed: unchanged          Burgess Amor, PA 09/19/12 0136

## 2012-09-19 NOTE — ED Notes (Signed)
Pt continues to vomit oral contrast, MD notified, CT notified.

## 2012-09-20 ENCOUNTER — Inpatient Hospital Stay (HOSPITAL_COMMUNITY): Payer: BC Managed Care – PPO | Admitting: Anesthesiology

## 2012-09-20 ENCOUNTER — Encounter (HOSPITAL_COMMUNITY): Payer: Self-pay | Admitting: Anesthesiology

## 2012-09-20 ENCOUNTER — Encounter (HOSPITAL_COMMUNITY): Payer: Self-pay | Admitting: *Deleted

## 2012-09-20 ENCOUNTER — Encounter (HOSPITAL_COMMUNITY)
Admission: EM | Disposition: A | Discharge status: Home / Self Care | Payer: Self-pay | Source: Home / Self Care | Attending: General Surgery

## 2012-09-20 HISTORY — PX: CHOLECYSTECTOMY: SHX55

## 2012-09-20 LAB — HEPATIC FUNCTION PANEL
Albumin: 2.7 g/dL — ABNORMAL LOW (ref 3.5–5.2)
Total Bilirubin: 0.4 mg/dL (ref 0.3–1.2)

## 2012-09-20 LAB — PHOSPHORUS: Phosphorus: 2.7 mg/dL (ref 2.3–4.6)

## 2012-09-20 LAB — BASIC METABOLIC PANEL
Chloride: 101 mEq/L (ref 96–112)
GFR calc Af Amer: 68 mL/min — ABNORMAL LOW (ref >90)
GFR calc non Af Amer: 58 mL/min — ABNORMAL LOW (ref >90)
Potassium: 3.9 mEq/L (ref 3.5–5.1)
Sodium: 136 mEq/L (ref 135–145)

## 2012-09-20 LAB — CBC
Platelets: 200 10*3/uL (ref 150–400)
RBC: 4 MIL/uL — ABNORMAL LOW (ref 4.22–5.81)
RDW: 13 % (ref 11.5–15.5)
WBC: 12.8 10*3/uL — ABNORMAL HIGH (ref 4.0–10.5)

## 2012-09-20 SURGERY — LAPAROSCOPIC CHOLECYSTECTOMY
Anesthesia: General | Site: Abdomen | Wound class: Contaminated

## 2012-09-20 MED ORDER — GLYCOPYRROLATE 0.2 MG/ML IJ SOLN
INTRAMUSCULAR | Status: DC | PRN
  Administered 2012-09-20: 0.6 mg via INTRAVENOUS

## 2012-09-20 MED ORDER — ONDANSETRON HCL 4 MG/2ML IJ SOLN
INTRAMUSCULAR | Status: AC
  Filled 2012-09-20: qty 2

## 2012-09-20 MED ORDER — HYDROMORPHONE HCL PF 1 MG/ML IJ SOLN
1.0000 mg | INTRAMUSCULAR | Status: DC | PRN
  Administered 2012-09-20: 1 mg via INTRAVENOUS
  Filled 2012-09-20: qty 1

## 2012-09-20 MED ORDER — ARTIFICIAL TEARS OP OINT
TOPICAL_OINTMENT | OPHTHALMIC | Status: AC
  Filled 2012-09-20: qty 3.5

## 2012-09-20 MED ORDER — SUCCINYLCHOLINE CHLORIDE 20 MG/ML IJ SOLN
INTRAMUSCULAR | Status: DC | PRN
  Administered 2012-09-20: 120 mg via INTRAVENOUS

## 2012-09-20 MED ORDER — SODIUM CHLORIDE 0.9 % IR SOLN
Status: DC | PRN
  Administered 2012-09-20: 1000 mL

## 2012-09-20 MED ORDER — MIDAZOLAM HCL 2 MG/2ML IJ SOLN
INTRAMUSCULAR | Status: AC
  Filled 2012-09-20: qty 2

## 2012-09-20 MED ORDER — SODIUM CHLORIDE 0.9 % IN NEBU
INHALATION_SOLUTION | RESPIRATORY_TRACT | Status: AC
  Filled 2012-09-20: qty 3

## 2012-09-20 MED ORDER — ALBUTEROL SULFATE (5 MG/ML) 0.5% IN NEBU
INHALATION_SOLUTION | RESPIRATORY_TRACT | Status: AC
  Filled 2012-09-20: qty 0.5

## 2012-09-20 MED ORDER — FENTANYL CITRATE 0.05 MG/ML IJ SOLN
INTRAMUSCULAR | Status: AC
  Filled 2012-09-20: qty 2

## 2012-09-20 MED ORDER — SUCCINYLCHOLINE CHLORIDE 20 MG/ML IJ SOLN
INTRAMUSCULAR | Status: AC
  Filled 2012-09-20: qty 1

## 2012-09-20 MED ORDER — LACTATED RINGERS IV SOLN
INTRAVENOUS | Status: DC
  Administered 2012-09-20: 1000 mL via INTRAVENOUS

## 2012-09-20 MED ORDER — BUPIVACAINE HCL (PF) 0.5 % IJ SOLN
INTRAMUSCULAR | Status: DC | PRN
  Administered 2012-09-20: 10 mL

## 2012-09-20 MED ORDER — MIDAZOLAM HCL 2 MG/2ML IJ SOLN
1.0000 mg | INTRAMUSCULAR | Status: DC | PRN
  Administered 2012-09-20: 2 mg via INTRAVENOUS

## 2012-09-20 MED ORDER — PROPOFOL 10 MG/ML IV EMUL
INTRAVENOUS | Status: AC
  Filled 2012-09-20: qty 20

## 2012-09-20 MED ORDER — NEOSTIGMINE METHYLSULFATE 1 MG/ML IJ SOLN
INTRAMUSCULAR | Status: DC | PRN
  Administered 2012-09-20: 4 mg via INTRAVENOUS

## 2012-09-20 MED ORDER — ROCURONIUM BROMIDE 100 MG/10ML IV SOLN
INTRAVENOUS | Status: DC | PRN
  Administered 2012-09-20: 5 mg via INTRAVENOUS
  Administered 2012-09-20: 30 mg via INTRAVENOUS

## 2012-09-20 MED ORDER — FENTANYL CITRATE 0.05 MG/ML IJ SOLN
INTRAMUSCULAR | Status: DC | PRN
  Administered 2012-09-20 (×6): 50 ug via INTRAVENOUS

## 2012-09-20 MED ORDER — HEMOSTATIC AGENTS (NO CHARGE) OPTIME
TOPICAL | Status: DC | PRN
  Administered 2012-09-20: 1 via TOPICAL

## 2012-09-20 MED ORDER — ACETAMINOPHEN 10 MG/ML IV SOLN
1000.0000 mg | Freq: Four times a day (QID) | INTRAVENOUS | Status: AC
  Administered 2012-09-20 – 2012-09-21 (×4): 1000 mg via INTRAVENOUS
  Filled 2012-09-20 (×4): qty 100

## 2012-09-20 MED ORDER — PROPOFOL 10 MG/ML IV BOLUS
INTRAVENOUS | Status: DC | PRN
  Administered 2012-09-20: 160 mg via INTRAVENOUS

## 2012-09-20 MED ORDER — FENTANYL CITRATE 0.05 MG/ML IJ SOLN
25.0000 ug | INTRAMUSCULAR | Status: DC | PRN
  Administered 2012-09-20: 17:00:00 via INTRAVENOUS
  Administered 2012-09-20: 50 ug via INTRAVENOUS

## 2012-09-20 MED ORDER — LIDOCAINE HCL (CARDIAC) 20 MG/ML IV SOLN
INTRAVENOUS | Status: DC | PRN
  Administered 2012-09-20: 40 mg via INTRAVENOUS

## 2012-09-20 MED ORDER — ONDANSETRON HCL 4 MG/2ML IJ SOLN: 4.0000 mg | Freq: Once | INTRAMUSCULAR | Status: DC | PRN

## 2012-09-20 MED ORDER — LIDOCAINE HCL (PF) 1 % IJ SOLN
INTRAMUSCULAR | Status: AC
  Filled 2012-09-20: qty 5

## 2012-09-20 MED ORDER — BUPIVACAINE HCL (PF) 0.5 % IJ SOLN
INTRAMUSCULAR | Status: AC
  Filled 2012-09-20: qty 30

## 2012-09-20 MED ORDER — GLYCOPYRROLATE 0.2 MG/ML IJ SOLN
0.2000 mg | Freq: Once | INTRAMUSCULAR | Status: AC
  Administered 2012-09-20: 0.2 mg via INTRAVENOUS

## 2012-09-20 MED ORDER — ONDANSETRON HCL 4 MG/2ML IJ SOLN
4.0000 mg | Freq: Once | INTRAMUSCULAR | Status: AC
  Administered 2012-09-20: 4 mg via INTRAVENOUS

## 2012-09-20 MED ORDER — SODIUM CHLORIDE 0.9 % IV SOLN
3.0000 g | Freq: Four times a day (QID) | INTRAVENOUS | Status: DC
  Administered 2012-09-20 – 2012-09-21 (×3): 3 g via INTRAVENOUS
  Filled 2012-09-20 (×8): qty 3

## 2012-09-20 MED ORDER — FENTANYL CITRATE 0.05 MG/ML IJ SOLN
INTRAMUSCULAR | Status: AC
  Filled 2012-09-20: qty 5

## 2012-09-20 MED ORDER — ENOXAPARIN SODIUM 40 MG/0.4ML ~~LOC~~ SOLN
40.0000 mg | SUBCUTANEOUS | Status: DC
  Administered 2012-09-21: 40 mg via SUBCUTANEOUS
  Filled 2012-09-20: qty 0.4

## 2012-09-20 MED ORDER — ALBUTEROL SULFATE (5 MG/ML) 0.5% IN NEBU
2.5000 mg | INHALATION_SOLUTION | Freq: Once | RESPIRATORY_TRACT | Status: AC
  Administered 2012-09-20: 2.5 mg via RESPIRATORY_TRACT

## 2012-09-20 MED ORDER — ONDANSETRON HCL 4 MG PO TABS: 4.0000 mg | ORAL_TABLET | Freq: Four times a day (QID) | ORAL | Status: DC | PRN

## 2012-09-20 MED ORDER — GLYCOPYRROLATE 0.2 MG/ML IJ SOLN
INTRAMUSCULAR | Status: AC
  Filled 2012-09-20: qty 1

## 2012-09-20 MED ORDER — NEOSTIGMINE METHYLSULFATE 1 MG/ML IJ SOLN
INTRAMUSCULAR | Status: AC
  Filled 2012-09-20: qty 10

## 2012-09-20 MED ORDER — ONDANSETRON HCL 4 MG/2ML IJ SOLN: 4.0000 mg | Freq: Four times a day (QID) | INTRAMUSCULAR | Status: DC | PRN

## 2012-09-20 MED ORDER — FENTANYL CITRATE 0.05 MG/ML IJ SOLN
50.0000 ug | Freq: Once | INTRAMUSCULAR | Status: AC
  Administered 2012-09-20: 50 ug via INTRAVENOUS

## 2012-09-20 MED ORDER — GLYCOPYRROLATE 0.2 MG/ML IJ SOLN
INTRAMUSCULAR | Status: AC
  Filled 2012-09-20: qty 3

## 2012-09-20 SURGICAL SUPPLY — 36 items
APPLIER CLIP LAPSCP 10X32 DD (CLIP) ×2 IMPLANT
BAG HAMPER (MISCELLANEOUS) ×2 IMPLANT
BAG SPEC RTRVL LRG 6X4 10 (ENDOMECHANICALS) ×1
CLOTH BEACON ORANGE TIMEOUT ST (SAFETY) ×2 IMPLANT
COVER LIGHT HANDLE STERIS (MISCELLANEOUS) ×4 IMPLANT
DECANTER SPIKE VIAL GLASS SM (MISCELLANEOUS) ×2 IMPLANT
DURAPREP 26ML APPLICATOR (WOUND CARE) ×2 IMPLANT
ELECT REM PT RETURN 9FT ADLT (ELECTROSURGICAL) ×2
ELECTRODE REM PT RTRN 9FT ADLT (ELECTROSURGICAL) ×1 IMPLANT
FILTER SMOKE EVAC LAPAROSHD (FILTER) ×2 IMPLANT
FORMALIN 10 PREFIL 120ML (MISCELLANEOUS) ×2 IMPLANT
GLOVE BIO SURGEON STRL SZ7.5 (GLOVE) ×2 IMPLANT
GLOVE BIOGEL PI IND STRL 7.0 (GLOVE) IMPLANT
GLOVE BIOGEL PI INDICATOR 7.0 (GLOVE) ×2
GLOVE ECLIPSE 6.5 STRL STRAW (GLOVE) ×1 IMPLANT
GLOVE EXAM NITRILE LRG STRL (GLOVE) ×1 IMPLANT
GLOVE SS BIOGEL STRL SZ 6.5 (GLOVE) IMPLANT
GLOVE SUPERSENSE BIOGEL SZ 6.5 (GLOVE) ×1
GOWN STRL REIN XL XLG (GOWN DISPOSABLE) ×6 IMPLANT
HEMOSTAT SNOW SURGICEL 2X4 (HEMOSTASIS) ×2 IMPLANT
INST SET LAPROSCOPIC AP (KITS) ×2 IMPLANT
KIT ROOM TURNOVER APOR (KITS) ×2 IMPLANT
KIT TROCAR LAP CHOLE (TROCAR) ×2 IMPLANT
MANIFOLD NEPTUNE II (INSTRUMENTS) ×2 IMPLANT
NS IRRIG 1000ML POUR BTL (IV SOLUTION) ×2 IMPLANT
PACK LAP CHOLE LZT030E (CUSTOM PROCEDURE TRAY) ×2 IMPLANT
PAD ARMBOARD 7.5X6 YLW CONV (MISCELLANEOUS) ×2 IMPLANT
POUCH SPECIMEN RETRIEVAL 10MM (ENDOMECHANICALS) ×2 IMPLANT
SET BASIN LINEN APH (SET/KITS/TRAYS/PACK) ×2 IMPLANT
SET TUBE IRRIG SUCTION NO TIP (IRRIGATION / IRRIGATOR) ×1 IMPLANT
SPONGE GAUZE 2X2 8PLY STRL LF (GAUZE/BANDAGES/DRESSINGS) ×8 IMPLANT
STAPLER VISISTAT (STAPLE) ×2 IMPLANT
SUT VICRYL 0 UR6 27IN ABS (SUTURE) ×2 IMPLANT
TAPE CLOTH SURG 4X10 WHT LF (GAUZE/BANDAGES/DRESSINGS) ×1 IMPLANT
WARMER LAPAROSCOPE (MISCELLANEOUS) ×2 IMPLANT
YANKAUER SUCT 12FT TUBE ARGYLE (SUCTIONS) ×2 IMPLANT

## 2012-09-20 NOTE — Anesthesia Postprocedure Evaluation (Signed)
  Anesthesia Post-op Note  Patient: Brett Gray  Procedure(s) Performed: Procedure(s) (LRB) with comments: LAPAROSCOPIC CHOLECYSTECTOMY (N/A)  Patient Location: PACU  Anesthesia Type: General  Level of Consciousness: awake, alert , oriented and patient cooperative  Airway and Oxygen Therapy: Patient Spontanous Breathing and Patient connected to face mask oxygen  Post-op Pain: mild  Post-op Assessment: Post-op Vital signs reviewed, Patient's Cardiovascular Status Stable, Respiratory Function Stable, Patent Airway and No signs of Nausea or vomiting  Post-op Vital Signs: Reviewed and stable  Complications: No apparent anesthesia complications  

## 2012-09-20 NOTE — Progress Notes (Signed)
Albuterol nebulizer treatment given for wheezing, tolerated well, pt denies shortness of breath.

## 2012-09-20 NOTE — Anesthesia Procedure Notes (Signed)
Procedure Name: Intubation Date/Time: 09/20/2012 2:39 PM Performed by: Glynn Octave E Pre-anesthesia Checklist: Patient identified, Patient being monitored, Timeout performed, Emergency Drugs available and Suction available Patient Re-evaluated:Patient Re-evaluated prior to inductionOxygen Delivery Method: Circle System Utilized Preoxygenation: Pre-oxygenation with 100% oxygen Intubation Type: IV induction, Rapid sequence and Cricoid Pressure applied Ventilation: Mask ventilation without difficulty Laryngoscope Size: Mac and 3 Grade View: Grade I Tube type: Oral Tube size: 7.0 mm Number of attempts: 1 Airway Equipment and Method: stylet Placement Confirmation: ETT inserted through vocal cords under direct vision,  positive ETCO2 and breath sounds checked- equal and bilateral Secured at: 21 cm Tube secured with: Tape Dental Injury: Teeth and Oropharynx as per pre-operative assessment

## 2012-09-20 NOTE — Anesthesia Preprocedure Evaluation (Addendum)
Anesthesia Evaluation  Patient identified by MRN, date of birth, ID band Patient awake    Reviewed: Allergy & Precautions, H&P , NPO status , Patient's Chart, lab work & pertinent test results  Airway Mallampati: II TM Distance: >3 FB     Dental  (+) Teeth Intact   Pulmonary neg pulmonary ROS,  breath sounds clear to auscultation        Cardiovascular + CAD, + Cardiac Stents and + Peripheral Vascular Disease (hx aortic transection post trauma) Rhythm:Regular Rate:Normal     Neuro/Psych    GI/Hepatic   Endo/Other    Renal/GU      Musculoskeletal   Abdominal (+)  Abdomen: rigid and tender.    Peds  Hematology   Anesthesia Other Findings   Reproductive/Obstetrics                          Anesthesia Physical Anesthesia Plan  ASA: III  Anesthesia Plan: General   Post-op Pain Management:    Induction: Intravenous, Rapid sequence and Cricoid pressure planned  Airway Management Planned: Oral ETT  Additional Equipment:   Intra-op Plan:   Post-operative Plan: Extubation in OR  Informed Consent: I have reviewed the patients History and Physical, chart, labs and discussed the procedure including the risks, benefits and alternatives for the proposed anesthesia with the patient or authorized representative who has indicated his/her understanding and acceptance.     Plan Discussed with:   Anesthesia Plan Comments:         Anesthesia Quick Evaluation

## 2012-09-20 NOTE — Transfer of Care (Signed)
  Anesthesia Post-op Note  Patient: Brett Gray  Procedure(s) Performed: Procedure(s) (LRB) with comments: LAPAROSCOPIC CHOLECYSTECTOMY (N/A)  Patient Location: PACU  Anesthesia Type: General  Level of Consciousness: awake, alert , oriented and patient cooperative  Airway and Oxygen Therapy: Patient Spontanous Breathing and Patient connected to face mask oxygen  Post-op Pain: mild  Post-op Assessment: Post-op Vital signs reviewed, Patient's Cardiovascular Status Stable, Respiratory Function Stable, Patent Airway and No signs of Nausea or vomiting  Post-op Vital Signs: Reviewed and stable  Complications: No apparent anesthesia complications

## 2012-09-20 NOTE — Op Note (Signed)
Patient:  Brett Gray  DOB:  [birth year]  MRN:  [id number]   Preop Diagnosis:  Cholecystitis, cholelithiasis  Postop Diagnosis:  Same, gangrene of gallbladder  Procedure:  Laparoscopic cholecystectomy  Surgeon:  Franky Macho, M.D.  Anes:  General endotracheal  Indications:  Patient is a 66 year old white male who presents with acute cholecystitis secondary to cholelithiasis. The risks and benefits of the procedure including bleeding, infection, hepatobiliary injury, and the possibility of an open procedure were fully explained to the patient, who gave informed consent.  Procedure note:  The patient was placed in the supine position. After induction of general endotracheal anesthesia, the abdomen was prepped and draped using usual sterile technique with DuraPrep. Surgical site confirmation was performed.  A supraumbilical incision was made down to the fascia. A Veress needle was introduced into the abdominal cavity and confirmation of placement was done using the saline drop test. The abdomen was then insufflated to 16 mm mercury pressure. An 11 mm trocar was introduced into the abdominal cavity under direct visualization without difficulty. An additional 11 mm trocar was placed in the epigastric region and 5 mm trochars were placed the right upper quadrant and right flank regions under direct visualization without difficulty. The gallbladder was inspected and noted to be somewhat gangrenous nature. The wall was thickened and distended. Incision was made into the fundus of the gallbladder and the gallbladder was decompressed in order to facilitate the dissection. The gallbladder was retracted in a dynamic fashion in order to identify the triangle of Calot. The cystic duct was first identified. Its junction to the infundibulum flow identified. Endoclips were placed proximally and distally on the cystic duct, and the cystic duct was divided. This is likewise done cystic artery. The gallbladder was  then freed away from the gallbladder fossa oozing Bovie electrocautery. The gallbladder was delivered through the epigastric trocar site using an Endo Catch bag. The gallbladder fossa was inspected and any bleeding was controlled using Bovie electrocautery. No bile leakage was noted. Surgicel was placed in the gallbladder fossa. All fluid and air were then evacuated from the abdominal cavity prior to removal of the trochars.  All wounds were irrigated with normal saline. All wounds were injected with 0.5% Sensorcaine. The epigastric fascia as well as supraumbilical fascia were reapproximated using 0 Vicryl interrupted sutures. All skin incisions were closed using staples. Betadine ointment after dressings were applied.  All tape and needle counts were correct at the end of the procedure. The patient was extubated in the operating room and transferred to PACU in stable condition.  Complications:  None  EBL:  Minimal  Specimen:  Gallbladder

## 2012-09-21 LAB — BASIC METABOLIC PANEL
BUN: 19 mg/dL (ref 6–23)
Calcium: 8.4 mg/dL (ref 8.4–10.5)
Chloride: 103 mEq/L (ref 96–112)
Creatinine, Ser: 1.07 mg/dL (ref 0.50–1.35)
GFR calc Af Amer: 82 mL/min — ABNORMAL LOW (ref >90)
GFR calc non Af Amer: 70 mL/min — ABNORMAL LOW (ref >90)

## 2012-09-21 LAB — CBC
HCT: 32.4 % — ABNORMAL LOW (ref 39.0–52.0)
MCHC: 33 g/dL (ref 30.0–36.0)
Platelets: 178 10*3/uL (ref 150–400)
RDW: 13.1 % (ref 11.5–15.5)

## 2012-09-21 MED ORDER — AMOXICILLIN-POT CLAVULANATE 875-125 MG PO TABS: 1.0000 | ORAL_TABLET | Freq: Two times a day (BID) | ORAL | Status: DC

## 2012-09-21 MED ORDER — OXYCODONE-ACETAMINOPHEN 7.5-325 MG PO TABS: 1.0000 | ORAL_TABLET | Freq: Four times a day (QID) | ORAL | Status: AC | PRN

## 2012-09-21 NOTE — Addendum Note (Signed)
Addendum  created 09/21/12 0745 by Marolyn Hammock, CRNA   Modules edited:Notes Section

## 2012-09-21 NOTE — Progress Notes (Signed)
Patient given all discharge prescriptions and instructions. All questions answered. Discharged via wheelchair with family and staff. 09/21/2012 Delma Freeze

## 2012-09-21 NOTE — Discharge Summary (Signed)
Physician Discharge Summary  Patient ID: Brett Gray MRN: [id number] DOB/AGE: [birth year] 66 y.o.  Admit date: 09/18/2012 Discharge date: 09/21/2012  Admission Diagnoses: Acute cholecystitis, cholelithiasis  Discharge Diagnoses: Same Active Problems:  * No active hospital problems. *    Discharged Condition: good  Hospital Course: Patient is a 66 year old white male who presented emergency room with worsening right upper quadrant abdominal pain, nausea, and vomiting. He was found to have acute cholecystitis with cholelithiasis. Surgery was consulted. The patient subsequently underwent laparoscopic cholecystectomy on 09/20/2012. He tolerated the procedure well. A gangrenous gallbladder was found. His post operative course has been unremarkable. His diet was advanced without difficulty.  The patient is being discharged home on 09/21/2012 in good improving condition.  Treatments: surgery: Laparoscopic cholecystectomy on 09/20/2012  Discharge Exam: Blood pressure 152/73, pulse 79, temperature 98.1 F (36.7 C), temperature source Oral, resp. rate 20, height 6' (1.829 m), weight 100.2 kg (220 lb 14.4 oz), SpO2 99.00%. General appearance: alert, cooperative and no distress Resp: clear to auscultation bilaterally Cardio: regular rate and rhythm, S1, S2 normal, no murmur, click, rub or gallop GI: Soft. Dressings dry and intact. No rigidity noted.  Disposition: 01-Home or Self Care     Medication List     As of 09/21/2012  8:58 AM    TAKE these medications         acetaminophen 500 MG tablet   Commonly known as: TYLENOL   Take 1,500 mg by mouth every 6 (six) hours as needed. Pain      amoxicillin-clavulanate 875-125 MG per tablet   Commonly known as: AUGMENTIN   Take 1 tablet by mouth 2 (two) times daily.      aspirin 81 MG tablet   Take 81 mg by mouth daily.      levothyroxine 100 MCG tablet   Commonly known as: SYNTHROID, LEVOTHROID   Take 1 tablet by mouth daily.     multivitamin with minerals Tabs   Take 1 tablet by mouth daily.      naproxen 500 MG tablet   Commonly known as: NAPROSYN   Take 500 mg by mouth daily.      oxyCODONE-acetaminophen 7.5-325 MG per tablet   Commonly known as: PERCOCET   Take 1-2 tablets by mouth every 6 (six) hours as needed for pain.      pantoprazole 40 MG tablet   Commonly known as: PROTONIX   Take 1 tablet (40 mg total) by mouth daily.      simvastatin 40 MG tablet   Commonly known as: ZOCOR   Take 1 tablet by mouth daily.           Follow-up Information    Follow up with Dalia Heading, MD. Schedule an appointment as soon as possible for a visit on 10/03/2012.   Contact information:   [address] [place] [address] [phone]          Signed: Franky Macho A 09/21/2012, 8:58 AM

## 2012-09-21 NOTE — Progress Notes (Signed)
UR chart review completed.  

## 2012-09-21 NOTE — Anesthesia Postprocedure Evaluation (Signed)
  Anesthesia Post-op Note  Patient: Brett Gray  Procedure(s) Performed: Procedure(s) (LRB) with comments: LAPAROSCOPIC CHOLECYSTECTOMY (N/A)  Patient Location: Room 322  Anesthesia Type: General  Level of Consciousness: awake, alert , oriented and patient cooperative  Airway and Oxygen Therapy: Patient Spontanous Breathing oxygen via nasal cannula  Post-op Pain: mild  Post-op Assessment: Post-op Vital signs reviewed, Patient's Cardiovascular Status Stable, Respiratory Function Stable, Patent Airway and No signs of Nausea or vomiting  Post-op Vital Signs: Reviewed and stable  Complications: No apparent anesthesia complications

## 2012-09-25 ENCOUNTER — Encounter (HOSPITAL_COMMUNITY): Payer: Self-pay | Admitting: General Surgery

## 2013-10-07 IMAGING — CT CT ABD-PELV W/ CM
2 of 4 series · 15 of 46 positions shown, 17 images · IV contrast (Omnipaque 300)
Comparison: CTA of the abdomen and pelvis performed 08/31/2010

CLINICAL DATA: Vomiting; upper quadrant abdominal pain and nausea.
Diarrhea.  Leukocytosis.

CT ABDOMEN AND PELVIS WITH CONTRAST
TECHNIQUE: Multidetector CT imaging of the abdomen and pelvis was
performed following the standard protocol during bolus
administration of intravenous contrast.
Contrast: 100mL OMNIPAQUE IOHEXOL 300 MG/ML  SOLN

[Series 2: abd_pel_with 5.0 b40f · axial · 0.74mm/px · z∈[-503,-98]mm · 12 of 91 slices shown, 14 images]
[im 5/91  soft-tissue]
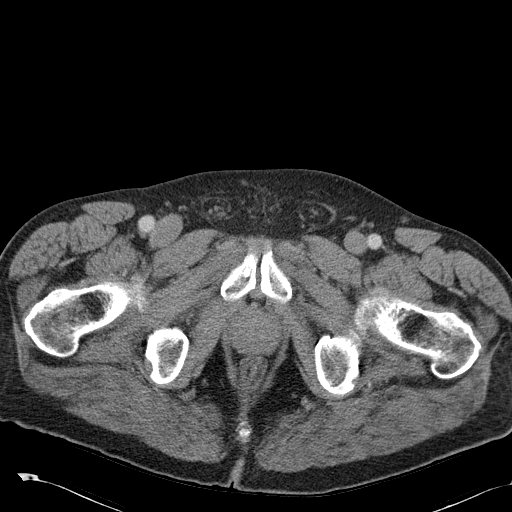
[im 5/91  bone]
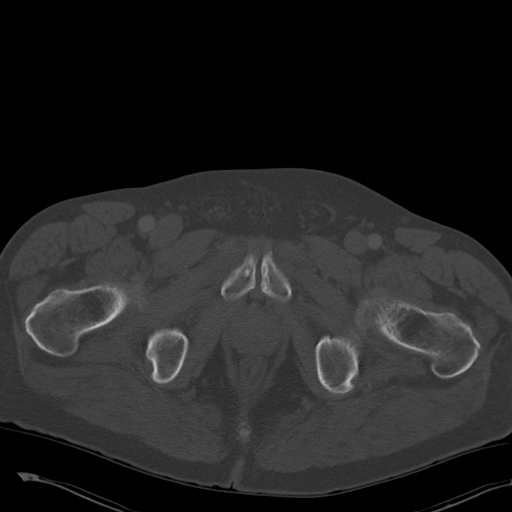
[im 14/91  soft-tissue]
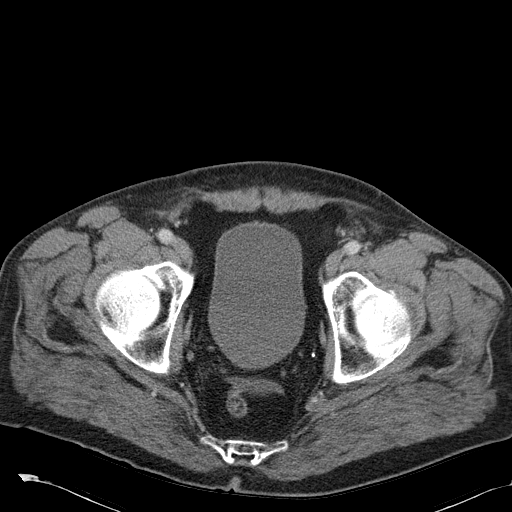
[im 19/91  soft-tissue]
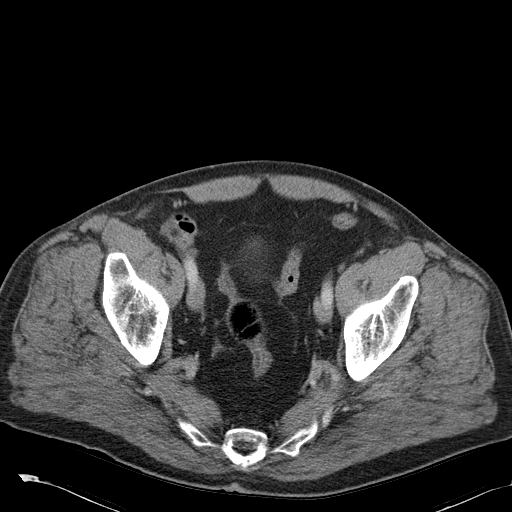
[im 28/91  soft-tissue]
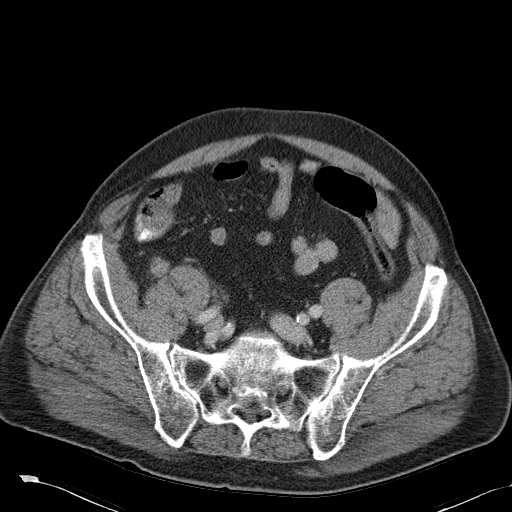
[im 37/91  soft-tissue]
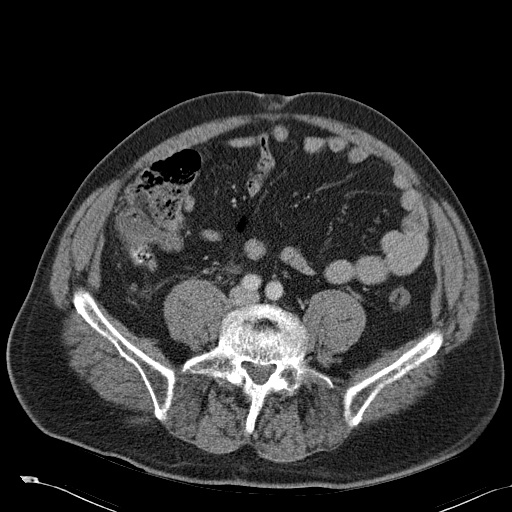
[im 41/91  soft-tissue]
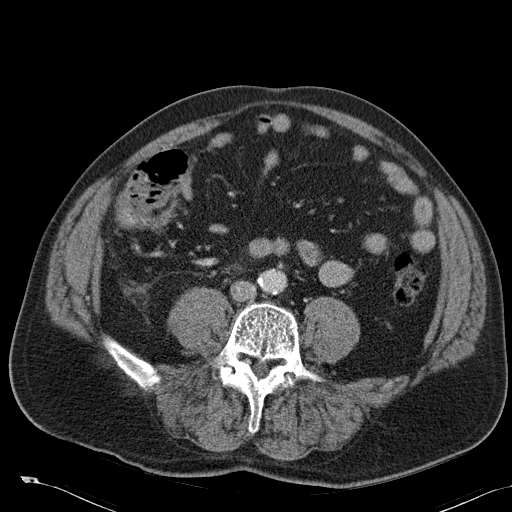
[im 50/91  soft-tissue]
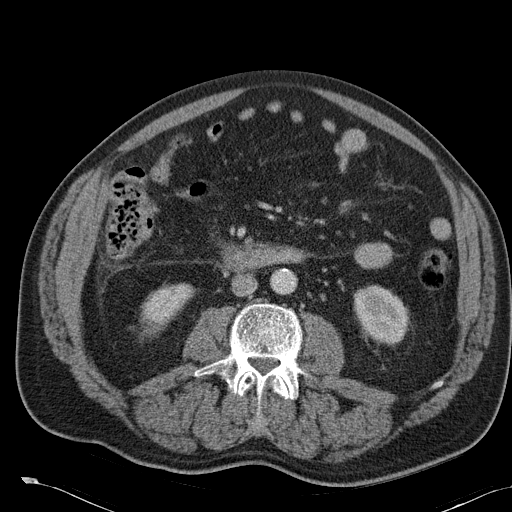
[im 55/91  soft-tissue]
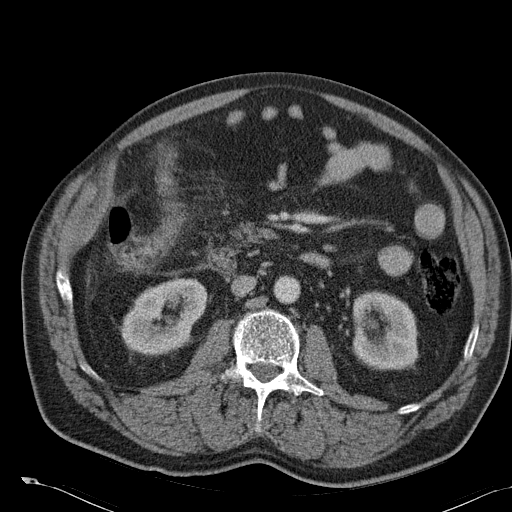
[im 64/91  soft-tissue]
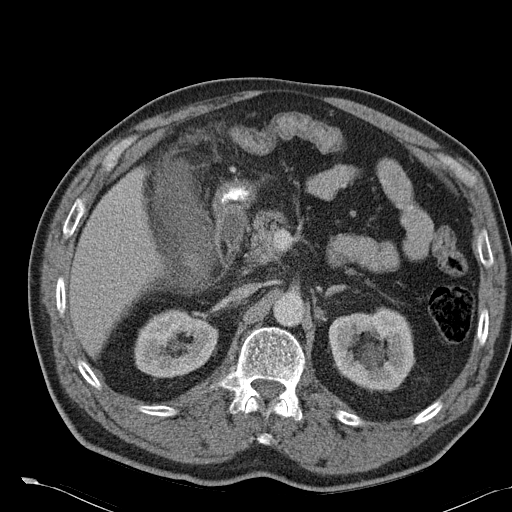
[im 64/91  bone]
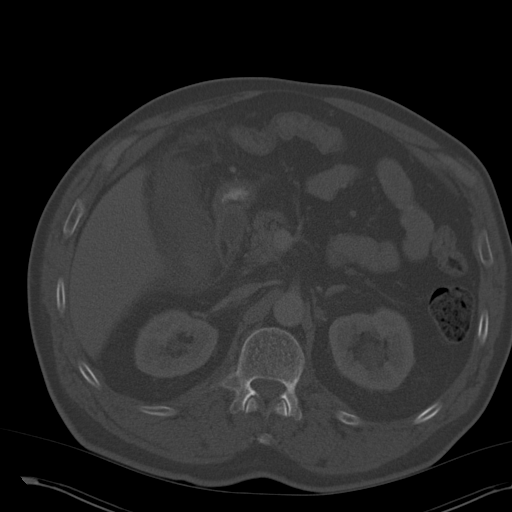
[im 73/91  soft-tissue]
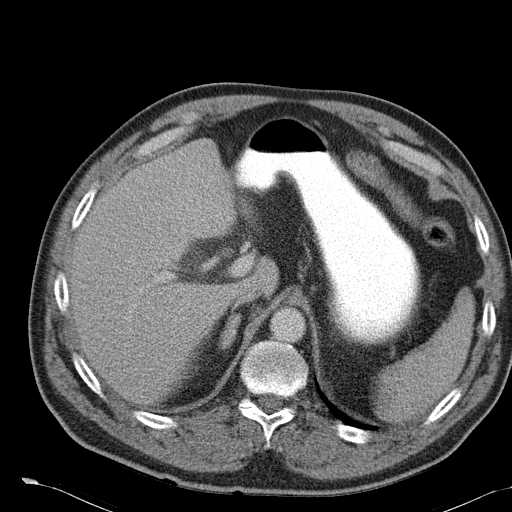
[im 77/91  soft-tissue]
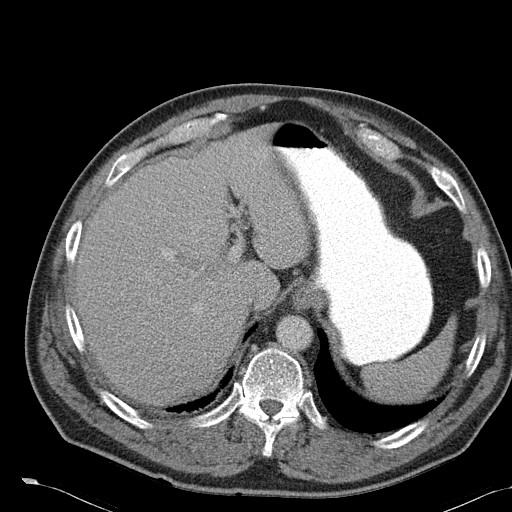
[im 86/91  soft-tissue]
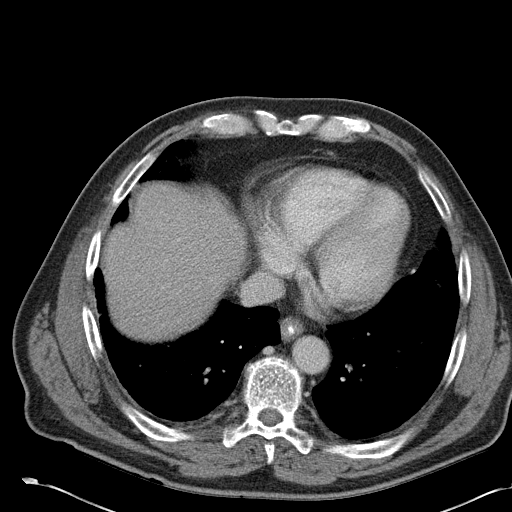

[Series 4: abd_pel_with 3.0 spo cor · coronal · 0.75mm/px · 3 of 104 slices shown]
[im 35/104  soft-tissue]
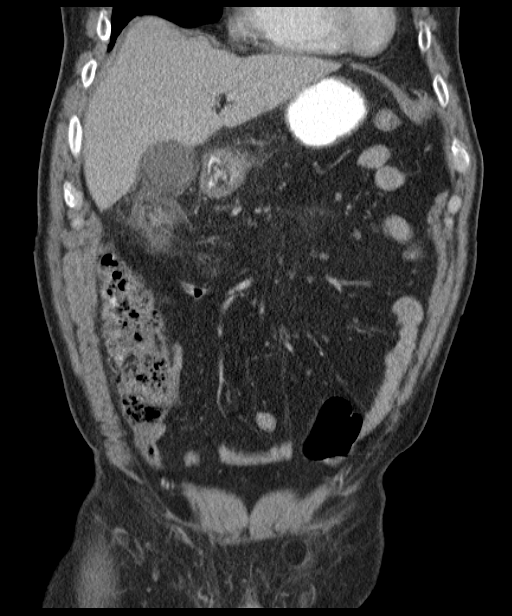
[im 46/104  soft-tissue]
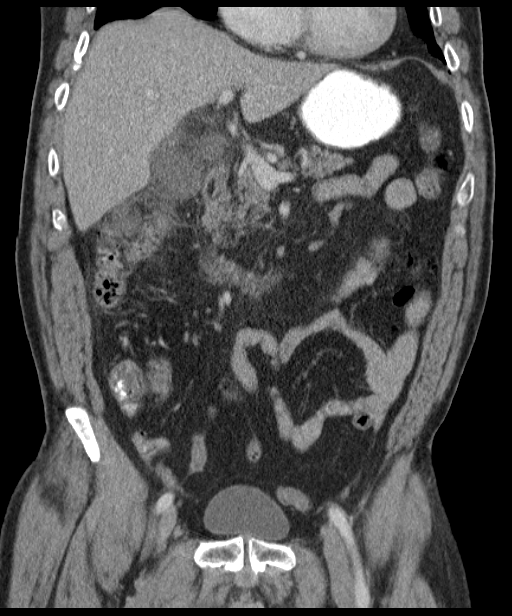
[im 58/104  soft-tissue]
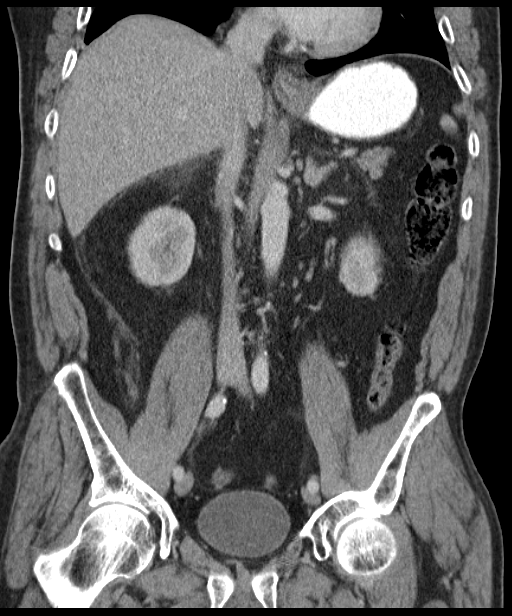

[15 of 46 positions shown; findings below may reference images not displayed]

FINDINGS: The visualized lung bases are clear.

There is prominent thickening of the gallbladder wall, with
associated pericholecystic fluid and significant associated soft
tissue inflammation.  Soft tissue inflammation tracks inferiorly
about the hepatic flexure of the colon, with significant bowel
inflammation also seen.  Inflammation extends inferiorly along
Gerota's fascia on the right.

There is mild soft tissue inflammation about the second segment of
the duodenum. Findings are compatible with acute cholecystitis;
there is no evidence of dilatation of the biliary ducts to suggest
obstruction.  A tiny amount of free fluid is noted about the liver,
and within the pelvis.

Nonspecific perinephric stranding is noted bilaterally.  A 2.1 cm
hypodensity near the upper pole of the left kidney reflects a known
parapelvic cyst. The kidneys are otherwise unremarkable in
appearance.  There is no evidence of hydronephrosis.  No renal or
ureteral stones are seen.

There is diffuse fatty infiltration within the wall of the second
segment of the duodenum, possibly reflecting chronic inflammation;
a small amount of fluid within the second segment of the duodenum
remains within normal limits.  The stomach is filled with contrast,
but it has not yet progressed past the pylorus.  No acute vascular
abnormalities are seen.  Scattered calcification is noted along the
abdominal aorta and its branches.

The appendix is normal in caliber, without evidence for
appendicitis.  Diffuse fatty infiltration within the wall of the
ascending and proximal transverse colon may reflect chronic
inflammation.

The bladder is mildly distended and grossly unremarkable.  The
prostate remains normal in size.  No inguinal lymphadenopathy is
seen.

No acute osseous abnormalities are identified.  Vacuum phenomenon
and disc space narrowing are noted at L4-L5.
IMPRESSION: 1.  Acute cholecystitis, with prominent thickening of the
gallbladder wall, associated pericholecystic fluid and significant
soft tissue inflammation.  Evaluation for stones is limited on CT.
No evidence for distal obstruction.
2.  Associated significant soft tissue inflammation tracking about
the hepatic flexure of the colon, with associated bowel
inflammation, and to a lesser extent about the second segment of
the duodenum.  Soft tissue stranding extends along Gerota's fascia
on the right side.
3.  Tiny amount of free fluid noted about the liver, and within the
pelvis.
4.  Diffuse fatty infiltration within the wall of the second
segment of [REDACTED] reflect chronic inflammation; there is similar
fatty infiltration within the wall of the ascending and proximal
transverse colon.
5.  The stomach is filled with contrast, but it has not yet
progressed past the pylorus.  This may reflect gastroparesis
secondary to the inflammatory gallbladder process.
6.  Left renal cyst again noted.
7.  Scattered calcification along the abdominal aorta and its
branches.

## 2014-05-13 ENCOUNTER — Telehealth: Payer: Self-pay | Admitting: *Deleted

## 2014-05-13 NOTE — Telephone Encounter (Signed)
Pt called to schedule a colonoscopy. Please advise [phone]

## 2014-05-14 NOTE — Telephone Encounter (Signed)
Pt's wife called again to set up pt's TCS, I made her aware that Tamela Oddi is taking patients back and I can send her a message and she will be in touch with her, please advise [phone]

## 2014-05-15 ENCOUNTER — Telehealth: Payer: Self-pay

## 2014-05-15 NOTE — Telephone Encounter (Signed)
Tried to call pt at the number listed and it sound like a fax. Could not get a call through.

## 2014-05-15 NOTE — Telephone Encounter (Signed)
See phone note of 05/13/2014. Pt has ov scheduled.

## 2014-05-15 NOTE — Telephone Encounter (Signed)
I called and spoke to pt's wife. He has had trouble with his bowels for sometime. Goes several days sometimes without a BM and has to take a laxative.  Scheduled for OV with Neil Crouch, PA on 06/19/2014 at 2:30 PM.  No preference for doctor.

## 2014-05-15 NOTE — Telephone Encounter (Signed)
Pt's wife was calling back today saying that she has left several messages and no one will talk to her or call her back. She is trying to schedule her husband's colonoscopy. I told her that DS had tried calling today at 1pm and the call would not go through. Pt said, "Well, maybe it was because I wasn't home to answer the phone". I told her that DS is aware and is having to take patients back and will call her back in 5 minutes per DS. Pt's wife agreed. [phone]

## 2014-06-19 ENCOUNTER — Other Ambulatory Visit: Payer: Self-pay

## 2014-06-19 ENCOUNTER — Encounter (HOSPITAL_COMMUNITY): Payer: Self-pay | Admitting: Pharmacy Technician

## 2014-06-19 ENCOUNTER — Ambulatory Visit (INDEPENDENT_AMBULATORY_CARE_PROVIDER_SITE_OTHER): Payer: BC Managed Care – PPO | Admitting: Gastroenterology

## 2014-06-19 ENCOUNTER — Encounter: Payer: Self-pay | Admitting: Gastroenterology

## 2014-06-19 VITALS — BP 123/71 | HR 75 | Temp 97.3°F | Ht 72.0 in | Wt 230.8 lb

## 2014-06-19 DIAGNOSIS — Z1211 Encounter for screening for malignant neoplasm of colon: Secondary | ICD-10-CM | POA: Insufficient documentation

## 2014-06-19 DIAGNOSIS — K5900 Constipation, unspecified: Secondary | ICD-10-CM | POA: Insufficient documentation

## 2014-06-19 MED ORDER — PEG 3350-KCL-NA BICARB-NACL 420 G PO SOLR: 4000.0000 mL | ORAL | Status: DC

## 2014-06-19 NOTE — Assessment & Plan Note (Signed)
Chronic constipation, adequately managed with milk of magnesia 1 tablet daily, stool softener daily. Discussed other options such as MiraLax. Patient is not interested in changing therapy at this time. Encouraged adequate fluid intake. Increase dietary fiber. He'll be having a colonoscopy in the near future. I recommended increasing his milk of magnesia several days before his preparation in order to ensure adequate cleansing.  I have discussed the risks, alternatives, benefits with regards to but not limited to the risk of reaction to medication, bleeding, infection, perforation and the patient is agreeable to proceed. Written consent to be obtained.

## 2014-06-19 NOTE — Progress Notes (Signed)
Primary Care Physician:  Purvis Kilts, MD  Primary Gastroenterologist:  Barney Drain, MD   Chief Complaint  Patient presents with  . Constipation    and schedule colonoscopy    HPI:  Brett Gray is a 68 y.o. male here to schedule his first ever colonoscopy. He has a history of chronic constipation. Self medicates with stool softeners and Phillips milk of magnesia tablets. Currently having a bowel movement about every other day. Only occasionally has straining or hard stools especially if he gets dehydrated. Denies any melena or rectal bleeding. A couple years ago he had moderate volume right rib blood per rectum in the setting of constipation. Since that time he has intermittently used preparation H if he has rectal irritation. Denies any heartburn, abdominal pain, dysphagia, weight loss.   Current Outpatient Prescriptions  Medication Sig Dispense Refill  . aspirin 81 MG tablet Take 81 mg by mouth daily.        Marland Kitchen levothyroxine (SYNTHROID, LEVOTHROID) 100 MCG tablet Take 125 mcg by mouth daily.       . Multiple Vitamin (MULTIVITAMIN WITH MINERALS) TABS Take 1 tablet by mouth daily.      . naproxen (NAPROSYN) 500 MG tablet Take 500 mg by mouth daily.      . NON FORMULARY Ducosate sodium  100 mg     Takes 2 tablets daily      . NON FORMULARY Phillips tablets    One tablet daily      . NON FORMULARY Megared   500 mg     One tablet daily      . simvastatin (ZOCOR) 40 MG tablet Take 1 tablet by mouth daily.        No current facility-administered medications for this visit.    Allergies as of 06/19/2014  . (No Known Allergies)    Past Medical History  Diagnosis Date  . Arthritis   . Hyperlipidemia   . Hernia, inguinal, bilateral   . Hypothyroidism     Past Surgical History  Procedure Laterality Date  . Aortic catheter      percutaneous endovascular repair of thoracic aortic transection  . Back surgery    . Inguinal hernia repair  2002    bilateral  . Cholecystectomy   09/20/2012    Procedure: LAPAROSCOPIC CHOLECYSTECTOMY;  Surgeon: Jamesetta So, MD;  Location: [facility];  Service: General;  Laterality: N/A;  . Wrist fracture surgery      Family History  Problem Relation Age of Onset  . Other Mother     Carotid artery  . Diabetes Mother   . Cancer Sister     gastric  . Heart disease Brother   . Colon cancer Neg Hx   . Breast cancer Sister   . Breast cancer Sister   . Skin cancer Brother     History   Social History  . Marital Status: Married    Spouse Name: N/A    Number of Children: N/A  . Years of Education: N/A   Occupational History  . Not on file.   Social History Main Topics  . Smoking status: Never Smoker   . Smokeless tobacco: Not on file     Comment: Never smoked  . Alcohol Use: No  . Drug Use: No  . Sexual Activity: Yes    Birth Control/ Protection: Post-menopausal   Other Topics Concern  . Not on file   Social History Narrative  . No narrative on file  ROS:  General: Negative for anorexia, weight loss, fever, chills, fatigue, weakness. Eyes: Negative for vision changes.  ENT: Negative for hoarseness, difficulty swallowing , nasal congestion. CV: Negative for chest pain, angina, palpitations, dyspnea on exertion, peripheral edema.  Respiratory: Negative for dyspnea at rest, dyspnea on exertion, cough, sputum, wheezing.  GI: See history of present illness. GU:  Negative for dysuria, hematuria, urinary incontinence, urinary frequency, nocturnal urination.  MS: Negative for joint pain, low back pain.  Derm: Negative for rash or itching.  Neuro: Negative for weakness, abnormal sensation, seizure, frequent headaches, memory loss, confusion.  Psych: Negative for anxiety, depression, suicidal ideation, hallucinations.  Endo: Negative for unusual weight change.  Heme: Negative for bruising or bleeding. Allergy: Negative for rash or hives.    Physical Examination:  BP 123/71  Pulse 75  Temp(Src) 97.3 F (36.3  C) (Oral)  Ht 6' (1.829 m)  Wt 230 lb 12.8 oz (104.69 kg)  BMI 31.30 kg/m2   General: Well-nourished, well-developed in no acute distress.  Head: Normocephalic, atraumatic.   Eyes: Conjunctiva pink, no icterus. Mouth: Oropharyngeal mucosa moist and pink , no lesions erythema or exudate. Neck: Supple without thyromegaly, masses, or lymphadenopathy.  Lungs: Clear to auscultation bilaterally.  Heart: Regular rate and rhythm, no murmurs rubs or gallops.  Abdomen: Bowel sounds are normal, nontender, nondistended, no hepatosplenomegaly or masses, no abdominal bruits or    hernia , no rebound or guarding.   Rectal: not performed Extremities: No lower extremity edema. No clubbing or deformities.  Neuro: Alert and oriented x 4 , grossly normal neurologically.  Skin: Warm and dry, no rash or jaundice.   Psych: Alert and cooperative, normal mood and affect.

## 2014-06-19 NOTE — Patient Instructions (Signed)
1. Colonoscopy with Dr. Oneida Alar. See separate instructions.  2. Make sure you are having daily BMs for several days before your colonoscopy prep. You may increase Philips MOM to maximum dose for 3 days before to help achieve this. Call if problems.

## 2014-06-20 NOTE — Progress Notes (Signed)
Cc to pcp °

## 2014-06-26 ENCOUNTER — Encounter (HOSPITAL_COMMUNITY)
Admission: RE | Disposition: A | Discharge status: Home / Self Care | Payer: Self-pay | Source: Ambulatory Visit | Attending: Gastroenterology

## 2014-06-26 ENCOUNTER — Encounter (HOSPITAL_COMMUNITY): Payer: Self-pay | Admitting: *Deleted

## 2014-06-26 ENCOUNTER — Ambulatory Visit (HOSPITAL_COMMUNITY)
Admission: RE | Admit: 2014-06-26 | Discharge: 2014-06-26 | Disposition: A | Discharge status: Home / Self Care | Payer: BC Managed Care – PPO | Source: Ambulatory Visit | Attending: Gastroenterology | Admitting: Gastroenterology

## 2014-06-26 DIAGNOSIS — M1990 Unspecified osteoarthritis, unspecified site: Secondary | ICD-10-CM | POA: Insufficient documentation

## 2014-06-26 DIAGNOSIS — Z1211 Encounter for screening for malignant neoplasm of colon: Secondary | ICD-10-CM | POA: Insufficient documentation

## 2014-06-26 DIAGNOSIS — D126 Benign neoplasm of colon, unspecified: Secondary | ICD-10-CM | POA: Diagnosis not present

## 2014-06-26 DIAGNOSIS — E785 Hyperlipidemia, unspecified: Secondary | ICD-10-CM | POA: Insufficient documentation

## 2014-06-26 DIAGNOSIS — E039 Hypothyroidism, unspecified: Secondary | ICD-10-CM | POA: Insufficient documentation

## 2014-06-26 DIAGNOSIS — K648 Other hemorrhoids: Secondary | ICD-10-CM | POA: Diagnosis not present

## 2014-06-26 HISTORY — PX: COLONOSCOPY: SHX5424

## 2014-06-26 SURGERY — COLONOSCOPY
Anesthesia: Moderate Sedation

## 2014-06-26 MED ORDER — MIDAZOLAM HCL 5 MG/5ML IJ SOLN
INTRAMUSCULAR | Status: DC
Start: 2014-06-26 — End: 2014-06-26
  Filled 2014-06-26: qty 10

## 2014-06-26 MED ORDER — MEPERIDINE HCL 100 MG/ML IJ SOLN
INTRAMUSCULAR | Status: DC | PRN
Start: 2014-06-26 — End: 2014-06-26
  Administered 2014-06-26 (×2): 25 mg via INTRAVENOUS

## 2014-06-26 MED ORDER — MEPERIDINE HCL 100 MG/ML IJ SOLN
INTRAMUSCULAR | Status: AC
  Filled 2014-06-26: qty 2

## 2014-06-26 MED ORDER — MIDAZOLAM HCL 5 MG/5ML IJ SOLN
INTRAMUSCULAR | Status: DC | PRN
  Administered 2014-06-26: 1 mg via INTRAVENOUS
  Administered 2014-06-26: 2 mg via INTRAVENOUS

## 2014-06-26 MED ORDER — SODIUM CHLORIDE 0.9 % IV SOLN
INTRAVENOUS | Status: DC
  Administered 2014-06-26: 10:00:00 via INTRAVENOUS

## 2014-06-26 MED ORDER — STERILE WATER FOR IRRIGATION IR SOLN
Status: DC | PRN
  Administered 2014-06-26: 10:00:00

## 2014-06-26 NOTE — Discharge Instructions (Signed)
You had  small polyp removed. You have internal hemorrhoids.    FOLLOW A HIGH FIBER DIET. AVOID ITEMS THAT CAUSE BLOATING & GAS. SEE INFO BELOW.  YOUR BIOPSY RESULTS SHOULD BE BACK IN 14 DAYS.  Next colonoscopy in 5-10 years.    Colonoscopy Care After Read the instructions outlined below and refer to this sheet in the next week. These discharge instructions provide you with general information on caring for yourself after you leave the hospital. While your treatment has been planned according to the most current medical practices available, unavoidable complications occasionally occur. If you have any problems or questions after discharge, call DR. Mahala Rommel, [phone].  ACTIVITY  You may resume your regular activity, but move at a slower pace for the next 24 hours.   Take frequent rest periods for the next 24 hours.   Walking will help get rid of the air and reduce the bloated feeling in your belly (abdomen).   No driving for 24 hours (because of the medicine (anesthesia) used during the test).   You may shower.   Do not sign any important legal documents or operate any machinery for 24 hours (because of the anesthesia used during the test).    NUTRITION  Drink plenty of fluids.   You may resume your normal diet as instructed by your doctor.   Begin with a light meal and progress to your normal diet. Heavy or fried foods are harder to digest and may make you feel sick to your stomach (nauseated).   Avoid alcoholic beverages for 24 hours or as instructed.    MEDICATIONS  You may resume your normal medications.   WHAT YOU CAN EXPECT TODAY  Some feelings of bloating in the abdomen.   Passage of more gas than usual.   Spotting of blood in your stool or on the toilet paper  .  IF YOU HAD POLYPS REMOVED DURING THE COLONOSCOPY:  Eat a soft diet IF YOU HAVE NAUSEA, BLOATING, ABDOMINAL PAIN, OR VOMITING.    FINDING OUT THE RESULTS OF YOUR TEST Not all test results  are available during your visit. DR. Oneida Alar WILL CALL YOU WITHIN 7 DAYS OF YOUR PROCEDUE WITH YOUR RESULTS. Do not assume everything is normal if you have not heard from DR. Joseth Weigel IN ONE WEEK, CALL HER OFFICE AT [phone].  SEEK IMMEDIATE MEDICAL ATTENTION AND CALL THE OFFICE: [phone] IF:  You have more than a spotting of blood in your stool.   Your belly is swollen (abdominal distention).   You are nauseated or vomiting.   You have a temperature over 101F.   You have abdominal pain or discomfort that is severe or gets worse throughout the day.   High-Fiber Diet A high-fiber diet changes your normal diet to include more whole grains, legumes, fruits, and vegetables. Changes in the diet involve replacing refined carbohydrates with unrefined foods. The calorie level of the diet is essentially unchanged. The Dietary Reference Intake (recommended amount) for adult males is 38 grams per day. For adult females, it is 25 grams per day. Pregnant and lactating women should consume 28 grams of fiber per day. Fiber is the intact part of a plant that is not broken down during digestion. Functional fiber is fiber that has been isolated from the plant to provide a beneficial effect in the body. PURPOSE  Increase stool bulk.   Ease and regulate bowel movements.   Lower cholesterol.  INDICATIONS THAT YOU NEED MORE FIBER  Constipation and hemorrhoids.  Uncomplicated diverticulosis (intestine condition) and irritable bowel syndrome.   Weight management.   As a protective measure against hardening of the arteries (atherosclerosis), diabetes, and cancer.   GUIDELINES FOR INCREASING FIBER IN THE DIET  Start adding fiber to the diet slowly. A gradual increase of about 5 more grams (2 slices of whole-wheat bread, 2 servings of most fruits or vegetables, or 1 bowl of high-fiber cereal) per day is best. Too rapid an increase in fiber may result in constipation, flatulence, and bloating.    Drink enough water and fluids to keep your urine clear or pale yellow. Water, juice, or caffeine-free drinks are recommended. Not drinking enough fluid may cause constipation.   Eat a variety of high-fiber foods rather than one type of fiber.   Try to increase your intake of fiber through using high-fiber foods rather than fiber pills or supplements that contain small amounts of fiber.   The goal is to change the types of food eaten. Do not supplement your present diet with high-fiber foods, but replace foods in your present diet.  INCLUDE A VARIETY OF FIBER SOURCES  Replace refined and processed grains with whole grains, canned fruits with fresh fruits, and incorporate other fiber sources. White rice, white breads, and most bakery goods contain little or no fiber.   Brown whole-grain rice, buckwheat oats, and many fruits and vegetables are all good sources of fiber. These include: broccoli, Brussels sprouts, cabbage, cauliflower, beets, sweet potatoes, white potatoes (skin on), carrots, tomatoes, eggplant, squash, berries, fresh fruits, and dried fruits.   Cereals appear to be the richest source of fiber. Cereal fiber is found in whole grains and bran. Bran is the fiber-rich outer coat of cereal grain, which is largely removed in refining. In whole-grain cereals, the bran remains. In breakfast cereals, the largest amount of fiber is found in those with "bran" in their names. The fiber content is sometimes indicated on the label.   You may need to include additional fruits and vegetables each day.   In baking, for 1 cup white flour, you may use the following substitutions:   1 cup whole-wheat flour minus 2 tablespoons.   1/2 cup white flour plus 1/2 cup whole-wheat flour.   Polyps, Colon  A polyp is extra tissue that grows inside your body. Colon polyps grow in the large intestine. The large intestine, also called the colon, is part of your digestive system. It is a long, hollow tube at  the end of your digestive tract where your body makes and stores stool. Most polyps are not dangerous. They are benign. This means they are not cancerous. But over time, some types of polyps can turn into cancer. Polyps that are smaller than a pea are usually not harmful. But larger polyps could someday become or may already be cancerous. To be safe, doctors remove all polyps and test them.   WHO GETS POLYPS? Anyone can get polyps, but certain people are more likely than others. You may have a greater chance of getting polyps if:  You are over 50.   You have had polyps before.   Someone in your family has had polyps.   Someone in your family has had cancer of the large intestine.   Find out if someone in your family has had polyps. You may also be more likely to get polyps if you:   Eat a lot of fatty foods   Smoke   Drink alcohol   Do not exercise  Eat too  much   TREATMENT  The caregiver will remove the polyp during sigmoidoscopy or colonoscopy.    PREVENTION There is not one sure way to prevent polyps. You might be able to lower your risk of getting them if you:  Eat more fruits and vegetables and less fatty food.   Do not smoke.   Avoid alcohol.   Exercise every day.   Lose weight if you are overweight.   Eating more calcium and folate can also lower your risk of getting polyps. Some foods that are rich in calcium are milk, cheese, and broccoli. Some foods that are rich in folate are chickpeas, kidney beans, and spinach.   Hemorrhoids Hemorrhoids are dilated (enlarged) veins around the rectum. Sometimes clots will form in the veins. This makes them swollen and painful. These are called thrombosed hemorrhoids. Causes of hemorrhoids include:  Constipation.   Straining to have a bowel movement.   HEAVY LIFTING HOME CARE INSTRUCTIONS  Eat a well balanced diet and drink 6 to 8 glasses of water every day to avoid constipation. You may also use a bulk laxative.    Avoid straining to have bowel movements.   Keep anal area dry and clean.   Do not use a donut shaped pillow or sit on the toilet for long periods. This increases blood pooling and pain.   Move your bowels when your body has the urge; this will require less straining and will decrease pain and pressure.

## 2014-06-26 NOTE — H&P (Signed)
Primary Care Physician:  Purvis Kilts, MD Primary Gastroenterologist:  Dr. Oneida Alar  Pre-Procedure History & Physical: HPI:  Brett Gray is a 68 y.o. male here for COLON CANCER SCREENING.  Past Medical History  Diagnosis Date  . Arthritis   . Hyperlipidemia   . Hernia, inguinal, bilateral   . Hypothyroidism     Past Surgical History  Procedure Laterality Date  . Aortic catheter      percutaneous endovascular repair of thoracic aortic transection  . Back surgery    . Inguinal hernia repair  2002    bilateral  . Cholecystectomy  09/20/2012    Procedure: LAPAROSCOPIC CHOLECYSTECTOMY;  Surgeon: Jamesetta So, MD;  Location: [facility];  Service: General;  Laterality: N/A;  . Wrist fracture surgery      Prior to Admission medications   Medication Sig Start Date End Date Taking? Authorizing Provider  aspirin 81 MG tablet Take 81 mg by mouth daily.     Yes Historical Provider, MD  levothyroxine (SYNTHROID, LEVOTHROID) 100 MCG tablet Take 125 mcg by mouth daily.  07/18/11  Yes Historical Provider, MD  Multiple Vitamin (MULTIVITAMIN WITH MINERALS) TABS Take 1 tablet by mouth daily.   Yes Historical Provider, MD  naproxen (NAPROSYN) 500 MG tablet Take 500 mg by mouth daily.   Yes Historical Provider, MD  NON FORMULARY Ducosate sodium  100 mg     Takes 2 tablets daily   Yes Historical Provider, MD  NON FORMULARY Phillips tablets    One tablet daily   Yes Historical Provider, MD  NON FORMULARY Megared   500 mg     One tablet daily   Yes Historical Provider, MD  simvastatin (ZOCOR) 40 MG tablet Take 1 tablet by mouth daily.  08/06/11  Yes Historical Provider, MD    Allergies as of 06/19/2014  . (No Known Allergies)    Family History  Problem Relation Age of Onset  . Other Mother     Carotid artery  . Diabetes Mother   . Cancer Sister     gastric  . Heart disease Brother   . Colon cancer Neg Hx   . Breast cancer Sister   . Breast cancer Sister   . Skin cancer Brother      History   Social History  . Marital Status: Married    Spouse Name: N/A    Number of Children: N/A  . Years of Education: N/A   Occupational History  . Not on file.   Social History Main Topics  . Smoking status: Never Smoker   . Smokeless tobacco: Not on file     Comment: Never smoked  . Alcohol Use: No  . Drug Use: No  . Sexual Activity: Yes    Birth Control/ Protection: Post-menopausal   Other Topics Concern  . Not on file   Social History Narrative  . No narrative on file    Review of Systems: See HPI, otherwise negative ROS   Physical Exam: BP 145/86  Pulse 67  Temp(Src) 97.9 F (36.6 C) (Oral)  Resp 18  Ht 6' (1.829 m)  Wt 230 lb (104.327 kg)  BMI 31.19 kg/m2  SpO2 95% General:   Alert,  pleasant and cooperative in NAD Head:  Normocephalic and atraumatic. Neck:  Supple; Lungs:  Clear throughout to auscultation.    Heart:  Regular rate and rhythm. Abdomen:  Soft, nontender and nondistended. Normal bowel sounds, without guarding, and without rebound.   Neurologic:  Alert and  oriented x4;  grossly normal neurologically.  Impression/Plan:     SCREENING  Plan:  1. TCS TODAY

## 2014-06-26 NOTE — Op Note (Signed)
[facility] [address] [place], [address]   COLONOSCOPY PROCEDURE REPORT  PATIENT: Brett, Gray  MR#: [id number] BIRTHDATE: 1945/12/01 , 54  yrs. old GENDER: Male ENDOSCOPIST: Barney Drain, MD REFERRED QM:GNOI Hilma Favors, M.D. PROCEDURE DATE:  06/26/2014 PROCEDURE:   Colonoscopy with cold biopsy polypectomy INDICATIONS:Average risk patient for colon cancer. MEDICATIONS: Demerol 50 mg IV and Versed 3 mg IV  DESCRIPTION OF PROCEDURE:    Physical exam was performed.  Informed consent was obtained from the patient after explaining the benefits, risks, and alternatives to procedure.  The patient was connected to monitor and placed in left lateral position. Continuous oxygen was provided by nasal cannula and IV medicine administered through an indwelling cannula.  After administration of sedation and rectal exam, the patients rectum was intubated and the EC-3890Li ([id number])  colonoscope was advanced under direct visualization to the cecum.  The scope was removed slowly by carefully examining the color, texture, anatomy, and integrity mucosa on the way out.  The patient was recovered in endoscopy and discharged home in satisfactory condition.     COLON FINDINGS: A sessile polyp measuring 2 mm in size was found in the ascending colon.  A polypectomy was performed with cold forceps.  , The LEFT colon IS redundant. PRESSURE APPLIED TO REACH CECUM.  The colon mucosa was otherwise normal.  , and Small internal hemorrhoids were found.  PREP QUALITY: excellent.  CECAL W/D TIME: 15 minutes COMPLICATIONS: None  ENDOSCOPIC IMPRESSION: 1.   ONE COLON POLYP REMOVED 2.   The LEFT colon IS redundant 3.   Small internal hemorrhoids  RECOMMENDATIONS: FOLLOW A HIGH FIBER DIET.  AVOID ITEMS THAT CAUSE BLOATING & GAS. BIOPSY RESULTS SHOULD BE BACK IN 14 DAYS.  Next colonoscopy in 5-10 years.  CONSIDER AN OVRTUBE.       _______________________________ Lorrin MaisBarney Drain, MD 06/26/2014 11:21 AM

## 2014-07-01 ENCOUNTER — Ambulatory Visit: Payer: Medicare Other | Admitting: Family

## 2014-07-01 ENCOUNTER — Encounter (HOSPITAL_COMMUNITY): Payer: Self-pay | Admitting: Gastroenterology

## 2014-07-04 ENCOUNTER — Encounter: Payer: Self-pay | Admitting: Family

## 2014-07-05 ENCOUNTER — Ambulatory Visit (INDEPENDENT_AMBULATORY_CARE_PROVIDER_SITE_OTHER): Payer: BC Managed Care – PPO | Admitting: Family

## 2014-07-05 ENCOUNTER — Encounter: Payer: Self-pay | Admitting: Family

## 2014-07-05 VITALS — BP 126/79 | HR 65 | Resp 16 | Ht 72.0 in | Wt 234.0 lb

## 2014-07-05 DIAGNOSIS — Z48812 Encounter for surgical aftercare following surgery on the circulatory system: Secondary | ICD-10-CM | POA: Insufficient documentation

## 2014-07-05 DIAGNOSIS — I712 Thoracic aortic aneurysm, without rupture, unspecified: Secondary | ICD-10-CM | POA: Insufficient documentation

## 2014-07-05 DIAGNOSIS — I711 Thoracic aortic aneurysm, ruptured, unspecified: Secondary | ICD-10-CM

## 2014-07-05 DIAGNOSIS — I7411 Embolism and thrombosis of thoracic aorta: Secondary | ICD-10-CM | POA: Insufficient documentation

## 2014-07-05 NOTE — Progress Notes (Signed)
[facility]  History of Present Illness  Brett Gray is a 68 y.o. (December 24, 1945) male patient of Dr. Rolm Bookbinder patient is back today for follow up, presumably to see a medical provider since it has been over a year since he has been seen and is scheduled for a follow up CTA abdomen/pelvis on 10/07/14, he also has an appointment later that day to see Dr. Trula Slade.  He is status post percutaneous endovascular repair of an aortic transection following trauma. The patient had fallen from a ladder and was found to have an aortic transection as well as orthopedic injuries. On July 25, 2010 he underwent percutaneous endovascular repair using a Cook [employer] device. He has been doing very well without complaints. He is back to his full functional status.  Most recent CTA (Date: 08/30/11) demonstrates: Stable appearance of the endovascular stent graft repair  of the distal transverse and proximal descending thoracic aorta.  No recurrent mediastinal hemorrhage, or developing aneurysm.   The patient has not had back or abdominal pain. Pt denies any history of stroke or TIA. Pt denies claudication symptoms with walking, denies non healing wounds.  He takes a daily 81 mg ASA and daily statin.  Pt Diabetic: No Pt smoker: non-smoker   Past Medical History  Diagnosis Date  . Arthritis   . Hyperlipidemia   . Hernia, inguinal, bilateral   . Hypothyroidism    Past Surgical History  Procedure Laterality Date  . Aortic catheter      percutaneous endovascular repair of thoracic aortic transection  . Back surgery    . Inguinal hernia repair  2002    bilateral  . Cholecystectomy  09/20/2012    Procedure: LAPAROSCOPIC CHOLECYSTECTOMY;  Surgeon: Jamesetta So, MD;  Location: [facility];  Service: General;  Laterality: N/A;  . Wrist fracture surgery    . Colonoscopy N/A 06/26/2014    Procedure: COLONOSCOPY;  Surgeon: Danie Binder, MD;  Location: [facility];  Service: Endoscopy;  Laterality: N/A;  1115-rescheduled to [place] notified pt  . Spine surgery    . Fracture surgery     Social History History  Substance Use Topics  . Smoking status: Never Smoker   . Smokeless tobacco: Never Used     Comment: Never smoked  . Alcohol Use: No   Family History Family History  Problem Relation Age of Onset  . Other Mother     Carotid artery  . Diabetes Mother   . Cancer Sister     gastric  . Heart disease Brother   . Colon cancer Neg Hx   . Breast cancer Sister   . Breast cancer Sister   . Skin cancer Brother    Current Outpatient Prescriptions on File Prior to Visit  Medication Sig Dispense Refill  . aspirin 81 MG tablet Take 81 mg by mouth daily.        Marland Kitchen levothyroxine (SYNTHROID, LEVOTHROID) 100 MCG tablet Take 125 mcg by mouth daily.       . Multiple Vitamin (MULTIVITAMIN WITH MINERALS) TABS Take 1 tablet by mouth daily.      . naproxen (NAPROSYN) 500 MG tablet Take 500 mg by mouth daily.      . NON FORMULARY Ducosate sodium  100 mg     Takes 2 tablets daily      . NON FORMULARY Phillips tablets    One tablet daily      . NON FORMULARY  Megared   500 mg     One tablet daily      . simvastatin (ZOCOR) 40 MG tablet Take 1 tablet by mouth daily.        No current facility-administered medications on file prior to visit.   No Known Allergies   ROS: See HPI for pertinent positives and negatives.  Physical Examination  Filed Vitals:   07/05/14 1029  BP: 126/79  Pulse: 65  Resp: 16  Height: 6' (1.829 m)  Weight: 234 lb (106.142 kg)  SpO2: 97%   Body mass index is 31.73 kg/(m^2).  General: A&O x 3, WD, obese male  Pulmonary: Sym exp, good air movt, CTAB, no rales, rhonchi, or wheezing.   Cardiac: RRR, Nl S1, S2, no Murmurs appreciated  Vascular: Vessel Right Left  Radial 2+Palpable 2+Palpable  Carotid palpable without bruit palpable without bruit  Aorta Not palpable N/A  Femoral 2+Palpable 2+Palpable   Popliteal Not palpable Not palpable  PT 2+Palpable 2+Palpable  DP 2+Palpable 2+Palpable   Gastrointestinal: soft, NTND, -G/R, - HSM, - masses, - CVAT B.  Musculoskeletal: M/S 5/5 throughout, Extremities without ischemic changes.  Neurologic: Pain and light touch intact in extremities, Motor exam as listed above.   CTA Abd/Pelvis Duplex (Date: 08/30/11) Stable appearance of the endovascular stent graft repair  of the distal transverse and proximal descending thoracic aorta.  No recurrent mediastinal hemorrhage, or developing aneurysm   Medical Decision Making  TEYTON PATTILLO is a 68 y.o. male who presents s/p percutaneous endovascular repair using a Cook Zenith TX2 device (Date: 07/25/10).  Pt is asymptomatic. He needed to have a medical evaluation prior to his CTA of the [facility] since it has been over a year since he has been seen by our practice.  I discussed with the patient the importance of surveillance of the endograft.  The next CTA isscheduled for 10/07/14.  The patient will follow up with Dr. Trula Slade on 10/07/14 with these studies.  I emphasized the importance of maximal medical management including strict control of blood pressure, blood glucose, and lipid levels, antiplatelet agents, obtaining regular exercise, and cessation of smoking.   The patient was given information about AAA including signs, symptoms, treatment, and how to minimize the risk of enlargement and rupture of aneurysms.    Thank you for allowing [place] to participate in this patient's care.  Clemon Chambers, RN, MSN, FNP-C [facility] Office: [phone]  Clinic Physician: Bridgett Larsson  07/05/2014, 10:33 AM

## 2014-07-05 NOTE — Patient Instructions (Signed)
Abdominal Aortic Aneurysm An aneurysm is a weakened or damaged part of an artery wall that bulges from the normal force of blood pumping through the body. An abdominal aortic aneurysm is an aneurysm that occurs in the lower part of the aorta, the main artery of the body.  The major concern with an abdominal aortic aneurysm is that it can enlarge and burst (rupture) or blood can flow between the layers of the wall of the aorta through a tear (aorticdissection). Both of these conditions can cause bleeding inside the body and can be life threatening unless diagnosed and treated promptly. CAUSES  The exact cause of an abdominal aortic aneurysm is unknown. Some contributing factors are:   A hardening of the arteries caused by the buildup of fat and other substances in the lining of a blood vessel (arteriosclerosis).  Inflammation of the walls of an artery (arteritis).   Connective tissue diseases, such as Marfan syndrome.   Abdominal trauma.   An infection, such as syphilis or staphylococcus, in the wall of the aorta (infectious aortitis) caused by bacteria. RISK FACTORS  Risk factors that contribute to an abdominal aortic aneurysm may include:  Age older than 60 years.   High blood pressure (hypertension).  Male gender.  Ethnicity (white race).  Obesity.  Family history of aneurysm (first degree relatives only).  Tobacco use. PREVENTION  The following healthy lifestyle habits may help decrease your risk of abdominal aortic aneurysm:  Quitting smoking. Smoking can raise your blood pressure and cause arteriosclerosis.  Limiting or avoiding alcohol.  Keeping your blood pressure, blood sugar level, and cholesterol levels within normal limits.  Decreasing your salt intake. In somepeople, too much salt can raise blood pressure and increase your risk of abdominal aortic aneurysm.  Eating a diet low in saturated fats and cholesterol.  Increasing your fiber intake by including  whole grains, vegetables, and fruits in your diet. Eating these foods may help lower blood pressure.  Maintaining a healthy weight.  Staying physically active and exercising regularly. SYMPTOMS  The symptoms of abdominal aortic aneurysm may vary depending on the size and rate of growth of the aneurysm.Most grow slowly and do not have any symptoms. When symptoms do occur, they may include:  Pain (abdomen, side, lower back, or groin). The pain may vary in intensity. A sudden onset of severe pain may indicate that the aneurysm has ruptured.  Feeling full after eating only small amounts of food.  Nausea or vomiting or both.  Feeling a pulsating lump in the abdomen.  Feeling faint or passing out. DIAGNOSIS  Since most unruptured abdominal aortic aneurysms have no symptoms, they are often discovered during diagnostic exams for other conditions. An aneurysm may be found during the following procedures:  Ultrasonography (A one-time screening for abdominal aortic aneurysm by ultrasonography is also recommended for all men aged 65-75 years who have ever smoked).  X-ray exams.  A computed tomography (CT).  Magnetic resonance imaging (MRI).  Angiography or arteriography. TREATMENT  Treatment of an abdominal aortic aneurysm depends on the size of your aneurysm, your age, and risk factors for rupture. Medication to control blood pressure and pain may be used to manage aneurysms smaller than 6 cm. Regular monitoring for enlargement may be recommended by your caregiver if:  The aneurysm is 3-4 cm in size (an annual ultrasonography may be recommended).  The aneurysm is 4-4.5 cm in size (an ultrasonography every 6 months may be recommended).  The aneurysm is larger than 4.5 cm in   size (your caregiver may ask that you be examined by a vascular surgeon). If your aneurysm is larger than 6 cm, surgical repair may be recommended. There are two main methods for repair of an aneurysm:   Endovascular  repair (a minimally invasive surgery). This is done most often.  Open repair. This method is used if an endovascular repair is not possible. Document Released: 07/14/2005 Document Revised: 01/29/2013 Document Reviewed: 11/03/2012 ExitCare Patient Information 2015 [employer]. This information is not intended to replace advice given to you by your health care provider. Make sure you discuss any questions you have with your health care provider.  

## 2014-07-18 ENCOUNTER — Telehealth: Payer: Self-pay | Admitting: Gastroenterology

## 2014-07-18 NOTE — Telephone Encounter (Signed)
Reminder in epic °

## 2014-07-18 NOTE — Telephone Encounter (Signed)
Please call pt. HE had a polypoid lesion removed and it was benign. FOLLOW A High fiber diet. TCS in 10 years.

## 2014-07-18 NOTE — Telephone Encounter (Signed)
Called and informed pts wife

## 2014-10-01 ENCOUNTER — Other Ambulatory Visit: Payer: Self-pay | Admitting: Surgery

## 2014-10-02 LAB — BUN: BUN: 22 mg/dL (ref 6–23)

## 2014-10-02 LAB — CREATININE, SERUM: Creat: 1.08 mg/dL (ref 0.50–1.35)

## 2014-10-04 ENCOUNTER — Encounter: Payer: Self-pay | Admitting: Surgery

## 2014-10-07 ENCOUNTER — Encounter: Payer: Self-pay | Admitting: Surgery

## 2014-10-07 ENCOUNTER — Ambulatory Visit
Admission: RE | Admit: 2014-10-07 | Discharge: 2014-10-07 | Disposition: A | Discharge status: Home / Self Care | Payer: BC Managed Care – PPO | Source: Ambulatory Visit | Attending: Surgery | Admitting: Surgery

## 2014-10-07 ENCOUNTER — Ambulatory Visit (INDEPENDENT_AMBULATORY_CARE_PROVIDER_SITE_OTHER): Payer: BC Managed Care – PPO | Admitting: Surgery

## 2014-10-07 VITALS — BP 137/81 | HR 70 | Ht 72.0 in | Wt 233.0 lb

## 2014-10-07 DIAGNOSIS — I711 Thoracic aortic aneurysm, ruptured, unspecified: Secondary | ICD-10-CM

## 2014-10-07 DIAGNOSIS — S2500XD Unspecified injury of thoracic aorta, subsequent encounter: Secondary | ICD-10-CM

## 2014-10-07 DIAGNOSIS — S2509XD Other specified injury of thoracic aorta, subsequent encounter: Secondary | ICD-10-CM

## 2014-10-07 MED ORDER — IOHEXOL 350 MG/ML SOLN
75.0000 mL | Freq: Once | INTRAVENOUS | Status: AC | PRN
  Administered 2014-10-07: 75 mL via INTRAVENOUS

## 2014-10-07 NOTE — Progress Notes (Signed)
Patient name: Brett Gray MRN: [id number] DOB: November 22, 1945 Sex: male     Chief Complaint  Patient presents with  . Re-evaluation    3 year f/u CTA priodr    HISTORY OF PRESENT ILLNESS: This is a 68 year old gentleman who comes in for further evaluation of his thoracic stent graft with CT scan.  He underwent percutaneous endovascular repair of an acute aortic transection after he fell from a ladder on 07/25/2010.  He is completely recovered from his injury.  Past Medical History  Diagnosis Date  . Arthritis   . Hyperlipidemia   . Hernia, inguinal, bilateral   . Hypothyroidism     Past Surgical History  Procedure Laterality Date  . Aortic catheter      percutaneous endovascular repair of thoracic aortic transection  . Back surgery    . Inguinal hernia repair  2002    bilateral  . Cholecystectomy  09/20/2012    Procedure: LAPAROSCOPIC CHOLECYSTECTOMY;  Surgeon: Jamesetta So, MD;  Location: [facility];  Service: General;  Laterality: N/A;  . Wrist fracture surgery    . Colonoscopy N/A 06/26/2014    Procedure: COLONOSCOPY;  Surgeon: Danie Binder, MD;  Location: [facility];  Service: Endoscopy;  Laterality: N/A;  1115-rescheduled to [place] notified pt  . Spine surgery    . Fracture surgery      History   Social History  . Marital Status: Married    Spouse Name: N/A    Number of Children: N/A  . Years of Education: N/A   Occupational History  . Not on file.   Social History Main Topics  . Smoking status: Never Smoker   . Smokeless tobacco: Never Used     Comment: Never smoked  . Alcohol Use: No  . Drug Use: No  . Sexual Activity: Yes    Birth Control/ Protection: Post-menopausal   Other Topics Concern  . Not on file   Social History Narrative    Family History  Problem Relation Age of Onset  . Other Mother     Carotid artery  . Diabetes Mother   . Cancer Mother   . Cancer Sister     gastric  . Heart disease Brother   . Cancer Brother     . Colon cancer Neg Hx   . Breast cancer Sister   . Breast cancer Sister   . Skin cancer Brother   . Deep vein thrombosis Father     Allergies as of 10/07/2014  . (No Known Allergies)    Current Outpatient Prescriptions on File Prior to Visit  Medication Sig Dispense Refill  . aspirin 81 MG tablet Take 81 mg by mouth daily.      Marland Kitchen levothyroxine (SYNTHROID, LEVOTHROID) 100 MCG tablet Take 125 mcg by mouth daily.     . Multiple Vitamin (MULTIVITAMIN WITH MINERALS) TABS Take 1 tablet by mouth daily.    . naproxen (NAPROSYN) 500 MG tablet Take 500 mg by mouth daily.    . NON FORMULARY Ducosate sodium  100 mg     Takes 2 tablets daily    . NON FORMULARY Phillips tablets    One tablet daily    . NON FORMULARY Megared   500 mg     One tablet daily    . simvastatin (ZOCOR) 40 MG tablet Take 1 tablet by mouth daily.      No current facility-administered medications on file prior to visit.  REVIEW OF SYSTEMS: Cardiovascular: No chest pain, chest pressure, palpitations, orthopnea, or dyspnea on exertion. No claudication or rest pain,  No history of DVT or phlebitis. Pulmonary: No productive cough, asthma or wheezing. Neurologic: No weakness, paresthesias, aphasia, or amaurosis. No dizziness. Hematologic: No bleeding problems or clotting disorders. Musculoskeletal: No joint pain or joint swelling. Gastrointestinal: No blood in stool or hematemesis Genitourinary: No dysuria or hematuria. Psychiatric:: No history of major depression. Integumentary: No rashes or ulcers. Constitutional: No fever or chills.  PHYSICAL EXAMINATION:   Vital signs are BP 137/81 mmHg  Pulse 70  Ht 6' (1.829 m)  Wt 233 lb (105.688 kg)  BMI 31.59 kg/m2  SpO2 98% General: The patient appears their stated age. HEENT:  No gross abnormalities Pulmonary:  Non labored breathing Abdomen: Soft and non-tender Musculoskeletal: There are no major deformities. Neurologic: No focal weakness or paresthesias are  detected, Skin: There are no ulcer or rashes noted. Psychiatric: The patient has normal affect. Cardiovascular: Palpable radial pulses   Diagnostic Studies CT scan shows good position of the stent graft with no complicating features  Assessment: Status post endovascular repair of aortic transection Plan: He is doing very well currently.  The stent is in good position.  He will need periodic surveillance.  I have scheduled him for CT angiogram in 3 years.  Eldridge Abrahams, M.D. [facility] Office: [phone] Pager:  [phone]

## 2015-05-07 ENCOUNTER — Ambulatory Visit (HOSPITAL_COMMUNITY)
Admission: RE | Admit: 2015-05-07 | Discharge: 2015-05-07 | Disposition: A | Discharge status: Home / Self Care | Payer: BLUE CROSS/BLUE SHIELD | Source: Ambulatory Visit | Attending: Physician Assistant | Admitting: Physician Assistant

## 2015-05-07 ENCOUNTER — Other Ambulatory Visit (HOSPITAL_COMMUNITY): Payer: Self-pay | Admitting: Physician Assistant

## 2015-05-07 DIAGNOSIS — Z0000 Encounter for general adult medical examination without abnormal findings: Secondary | ICD-10-CM

## 2015-05-07 DIAGNOSIS — Z0001 Encounter for general adult medical examination with abnormal findings: Secondary | ICD-10-CM

## 2015-05-07 DIAGNOSIS — Z1389 Encounter for screening for other disorder: Secondary | ICD-10-CM

## 2015-05-07 DIAGNOSIS — R062 Wheezing: Secondary | ICD-10-CM | POA: Diagnosis not present

## 2015-06-25 ENCOUNTER — Other Ambulatory Visit (HOSPITAL_COMMUNITY): Payer: Self-pay | Admitting: Physician Assistant

## 2015-06-25 DIAGNOSIS — M7989 Other specified soft tissue disorders: Secondary | ICD-10-CM

## 2015-06-26 ENCOUNTER — Ambulatory Visit (HOSPITAL_COMMUNITY)
Admission: RE | Admit: 2015-06-26 | Discharge: 2015-06-26 | Disposition: A | Discharge status: Home / Self Care | Payer: BLUE CROSS/BLUE SHIELD | Source: Ambulatory Visit | Attending: Physician Assistant | Admitting: Physician Assistant

## 2015-06-26 DIAGNOSIS — M7989 Other specified soft tissue disorders: Secondary | ICD-10-CM | POA: Diagnosis not present

## 2017-01-25 ENCOUNTER — Encounter: Payer: Self-pay | Admitting: Surgery

## 2017-02-08 ENCOUNTER — Ambulatory Visit (INDEPENDENT_AMBULATORY_CARE_PROVIDER_SITE_OTHER): Payer: BLUE CROSS/BLUE SHIELD

## 2017-02-08 ENCOUNTER — Ambulatory Visit (INDEPENDENT_AMBULATORY_CARE_PROVIDER_SITE_OTHER): Payer: BLUE CROSS/BLUE SHIELD | Admitting: Orthopedic Surgery

## 2017-02-08 ENCOUNTER — Encounter: Payer: Self-pay | Admitting: Orthopedic Surgery

## 2017-02-08 VITALS — BP 104/58 | HR 81 | Ht 70.0 in | Wt 228.0 lb

## 2017-02-08 DIAGNOSIS — M25561 Pain in right knee: Secondary | ICD-10-CM

## 2017-02-08 DIAGNOSIS — M1711 Unilateral primary osteoarthritis, right knee: Secondary | ICD-10-CM

## 2017-02-08 NOTE — Patient Instructions (Addendum)
You have received an injection of steroids into the joint. 15% of patients will have increased pain within the 24 hours postinjection.   This is transient and will go away.   We recommend that you use ice packs on the injection site for 20 minutes every 2 hours and extra strength Tylenol 2 tablets every 8 as needed until the pain resolves.  If you continue to have pain after taking the Tylenol and using the ice please call the office for further instructions.  Total Knee Replacement Total knee replacement is a surgery to replace your knee joint with a man-made (prosthetic) joint. The man-made joint is called a prosthesis. It replaces parts of the thigh bone (femur), lower leg bone (tibia), and kneecap (patella). This surgery is done to lessen pain and improve knee movement. What happens before the procedure?  Ask your doctor about:  Changing or stopping your normal medicines. This is especially important if you take diabetes medicines or blood thinners.  Taking medicines such as aspirin and ibuprofen. These medicines can thin your blood. Do not take these medicines before your procedure if your doctor tells you not to.  Get all dental care that you need done before your procedure. Plan to not have dental work done for 3 months after your surgery.  Follow instructions from your doctor about what you cannot eat or drink.  Ask your doctor how your surgical site will be marked or identified.  You may be given antibiotic medicine to help prevent infection.  If your doctor prescribes physical therapy, do exercises as told.  Do not use any tobacco products, such as cigarettes, chewing tobacco, or e-cigarettes. If you need help quitting, ask your doctor.  You may have a physical exam.  You may have tests, such as:  X-rays.  MRI.  CT scan.  Bone scans.  You may have a blood or urine sample taken.  Plan to have someone take you home after the procedure.  If you will be going home  right after the procedure, plan to have someone with you for at least 24 hours. It is best to have someone help care for you for at least 4-6 weeks after surgery. What happens during the procedure?  To reduce your risk of infection:  Your health care team will wash or sanitize their hands.  Your skin will be washed with soap.  An IV tube will be put into one of your veins.  You will be given one or more of the following:  Sedative. This is a medicine that makes you relaxed.  Local anesthetic. This is a medicine to numb the area.  General anesthetic. This is a medicine that makes you fall asleep.  Spinal anesthetic. This is a medicine that numbs your body below the waist.  Regional anesthetic. This is a medicine that numbs everything below the injection site.  A cut (incision) will be made in your knee.  Damaged parts of your thigh bone, lower leg bone, and kneecap will be removed.  A piece of metal (liner) will be placed on your thigh bone. Pieces of plastic will be placed on your lower leg bone and the underside of your kneecap.  One or more small tubes (drains) may be placed near your cut to help drain fluid.  Your cut will be closed with stitches (sutures), skin glue, or skin tape (adhesive) strips. Medicine may be put on your cut.  A bandage (dressing) will be placed over your cut. The procedure may  vary among doctors and hospitals. What happens after the procedure?  Your blood pressure, heart rate, breathing rate, and blood oxygen level will be monitored often until the medicines you were given have worn off.  You may continue to get fluids and medicines through an IV tube.  You will have some pain. There will be medicines to help you.  You may have fluid coming from a drain.  You may have to wear special socks (compression stockings). These help to prevent blood clots and reduce swelling in your legs.  You will be told to move around as much as possible.  You  may be given a continuous passive motion machine to use at home. You will be shown how to use this machine.  Do not drive for 24 hours if you received a sedative. This information is not intended to replace advice given to you by your health care provider. Make sure you discuss any questions you have with your health care provider. Document Released: 12/27/2011 Document Revised: 06/07/2016 Document Reviewed: 09/10/2015 Elsevier Interactive Patient Education  2017 [employer] is a term that is commonly used to refer to joint pain or joint disease. There are more than 100 types of arthritis. What are the causes? The most common cause of this condition is wear and tear of a joint. Other causes include:  Gout.  Inflammation of a joint.  An infection of a joint.  Sprains and other injuries near the joint.  A drug reaction or allergic reaction. In some cases, the cause may not be known. What are the signs or symptoms? The main symptom of this condition is pain in the joint with movement. Other symptoms include:  Redness, swelling, or stiffness at a joint.  Warmth coming from the joint.  Fever.  Overall feeling of illness. How is this diagnosed? This condition may be diagnosed with a physical exam and tests, including:  Blood tests.  Urine tests.  Imaging tests, such as MRI, X-rays, or a CT scan. Sometimes, fluid is removed from a joint for testing. How is this treated? Treatment for this condition may involve:  Treatment of the cause, if it is known.  Rest.  Raising (elevating) the joint.  Applying cold or hot packs to the joint.  Medicines to improve symptoms and reduce inflammation.  Injections of a steroid such as cortisone into the joint to help reduce pain and inflammation. Depending on the cause of your arthritis, you may need to make lifestyle changes to reduce stress on your joint. These changes may include exercising more and losing  weight. Follow these instructions at home: Medicines   Take over-the-counter and prescription medicines only as told by your health care provider.  Do not take aspirin to relieve pain if gout is suspected. Activity   Rest your joint if told by your health care provider. Rest is important when your disease is active and your joint feels painful, swollen, or stiff.  Avoid activities that make the pain worse. It is important to balance activity with rest.  Exercise your joint regularly with range-of-motion exercises as told by your health care provider. Try doing low-impact exercise, such as:  Swimming.  Water aerobics.  Biking.  Walking. Joint Care    If your joint is swollen, keep it elevated if told by your health care provider.  If your joint feels stiff in the morning, try taking a warm shower.  If directed, apply heat to the joint. If you have diabetes, do  not apply heat without permission from your health care provider.  Put a towel between the joint and the hot pack or heating pad.  Leave the heat on the area for 20-30 minutes.  If directed, apply ice to the joint:  Put ice in a plastic bag.  Place a towel between your skin and the bag.  Leave the ice on for 20 minutes, 2-3 times per day.  Keep all follow-up visits as told by your health care provider. This is important. Contact a health care provider if:  The pain gets worse.  You have a fever. Get help right away if:  You develop severe joint pain, swelling, or redness.  Many joints become painful and swollen.  You develop severe back pain.  You develop severe weakness in your leg.  You cannot control your bladder or bowels. This information is not intended to replace advice given to you by your health care provider. Make sure you discuss any questions you have with your health care provider. Document Released: 11/11/2004 Document Revised: 03/11/2016 Document Reviewed: 12/30/2014 Elsevier  Interactive Patient Education  2017 [employer].

## 2017-02-08 NOTE — Progress Notes (Signed)
Patient ID: Brett Gray, male   DOB: 11/03/1945, 72 y.o.   MRN: [id number]  Chief Complaint  Patient presents with  . 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 It is especially bad when he tries to cross 1 leg the right over the left. No trauma. He has been on some naproxen with fairly good result he's here for evaluation and treatment    Review of Systems  Respiratory: Negative for shortness of breath.   Cardiovascular: Negative for chest pain.  Musculoskeletal: Negative for back pain.  Neurological: Negative for tingling and focal weakness.    Past Medical History:  Diagnosis Date  . Arthritis   . Hernia, inguinal, bilateral   . Hyperlipidemia   . Hypothyroidism     Past Surgical History:  Procedure Laterality Date  . Aortic catheter     percutaneous endovascular repair of thoracic aortic transection  . BACK SURGERY    . CHOLECYSTECTOMY  09/20/2012   Procedure: LAPAROSCOPIC CHOLECYSTECTOMY;  Surgeon: Jamesetta So, MD;  Location: [facility];  Service: General;  Laterality: N/A;  . COLONOSCOPY N/A 06/26/2014   Procedure: COLONOSCOPY;  Surgeon: Danie Binder, MD;  Location: [facility];  Service: Endoscopy;  Laterality: N/A;  1115-rescheduled to [place] notified pt  . FRACTURE SURGERY    . INGUINAL HERNIA REPAIR  2002   bilateral  . SPINE SURGERY    . WRIST FRACTURE SURGERY      PHYSICAL EXAM  BP (!) 104/58   Pulse 81   Ht 5\' 10"  (1.778 m)   Wt 228 lb (103.4 kg)   BMI 32.71 kg/m  GENERAL appearance reveals no gross abnormalities, normal development grooming and hygiene   MENTAL STATUS we note that the patient is awake alert and oriented to person place and time MOOD/AFFECT ARE NORMAL   GAIT reveals NO limp in the effected limb  EXAM OF THE RIGHT  KNEE SKIN no erythema lacerations or ecchymosis  INSPECTION  MODERATE  tenderness over the MEDIAL  joint line and NO joint effusion  ROM is 125 STABILITY tests for the acl (anterior drawer) and pcl posterior drawer are normal. the collateral ligaments are stable with varus valgus stress testing  MOTOR GRADE 5/5 in the quad muscles    examination of the  LEFT knee  NROM   VASC 2+ dorsalis pedis pulse normal capillary refill excellent warmth to the extremity  NEURO normal sensation and no pathologic reflexes  LYMPH deferred noncontributory   IMAGING STUDIES  I have independently reviewed the x-rays and I interpreted the x-rays as follows:  Medial compartment arthritis grade 4 varus alignment moderate  Dx   primary osteoarthritis of the right knee  PLAN  DonJoy right medial arthritis brace, injection  Procedure note right knee injection verbal consent was obtained to inject right knee joint  Timeout was completed to confirm the site of injection  The medications used were 40 mg of Depo-Medrol and 1% lidocaine 3 cc  Anesthesia was provided by ethyl chloride and the skin was prepped with alcohol.  After cleaning the skin with alcohol a 20-gauge needle was used to inject the right knee joint. There were no complications. A sterile bandage was applied.   11:17 AM Arther Abbott, MD 02/08/2017

## 2017-02-22 ENCOUNTER — Encounter (HOSPITAL_COMMUNITY)
Admission: RE | Disposition: A | Discharge status: Home / Self Care | Payer: Self-pay | Source: Ambulatory Visit | Attending: Ophthalmology

## 2017-02-22 ENCOUNTER — Ambulatory Visit (HOSPITAL_COMMUNITY)
Admission: RE | Admit: 2017-02-22 | Discharge: 2017-02-22 | Disposition: A | Discharge status: Home / Self Care | Payer: BLUE CROSS/BLUE SHIELD | Source: Ambulatory Visit | Attending: Ophthalmology | Admitting: Ophthalmology

## 2017-02-22 ENCOUNTER — Encounter (HOSPITAL_COMMUNITY): Payer: Self-pay | Admitting: *Deleted

## 2017-02-22 DIAGNOSIS — H2640 Unspecified secondary cataract: Secondary | ICD-10-CM | POA: Diagnosis present

## 2017-02-22 HISTORY — PX: YAG LASER APPLICATION: SHX6189

## 2017-02-22 SURGERY — TREATMENT, USING YAG LASER
Anesthesia: LOCAL | Laterality: Left

## 2017-02-22 MED ORDER — CYCLOPENTOLATE-PHENYLEPHRINE 0.2-1 % OP SOLN
OPHTHALMIC | Status: AC
  Filled 2017-02-22: qty 2

## 2017-02-22 MED ORDER — CYCLOPENTOLATE-PHENYLEPHRINE 0.2-1 % OP SOLN
1.0000 [drp] | OPHTHALMIC | Status: AC
  Administered 2017-02-22 (×3): 1 [drp] via OPHTHALMIC

## 2017-02-22 NOTE — Op Note (Signed)
Krisa Blattner T. Gershon Crane, MD  Procedure: Yag Capsulotomy  Yag Laser Self Test Completedyes. Procedure: Posterior Capsulotomy, Eye Protection Worn by Staff yes. Laser In Use Sign on Door yes.  Laser: Nd:YAG Spot Size: Fixed Burst Mode: III Power Setting: 3.4 mJ/burst Number of shots: 12 Total energy delivered: 32.18 mJ   The patient tolerated the procedure without difficulty. No complications were encountered.   The patient was discharged home with the instructions to continue all her current glaucoma medications, if any.   Patient instructed to go to office at 0100 for intraocular pressure check.  Patient verbalizes understanding of discharge instructions Yes.  .    Pre-Operative Diagnosis: After-Cataract, obscuring vision, 366.53 OS Post-Operative Diagnosis: After-Cataract, obscuring vision, 366.53 OS Date of Cataract Surgery: 08/28/2014

## 2017-02-22 NOTE — H&P (Signed)
The patient was re examined and there is no change in the patients condition since the original H and P. 

## 2017-02-22 NOTE — Discharge Instructions (Signed)
DENT PLANTZ  02/22/2017     Instructions    Activity: No Restrictions.   Diet: Resume Diet you were on at home.   Pain Medication: Tylenol if Needed.   CONTACT YOUR DOCTOR IF YOU HAVE PAIN, REDNESS IN YOUR EYE, OR DECREASED VISION.   Follow-up:today at 1:00 PM with Rutherford Guys, MD.   Dr. Gershon Crane: [phone]  Dr. Iona Hansen: [phone]  Dr. Geoffry Paradise: [phone]   If you find that you cannot contact your physician, but feel that your signs and   Symptoms warrant a physician's attention, call the Emergency Room at   [phone] [phone].   Othern/a.

## 2017-02-24 ENCOUNTER — Encounter (HOSPITAL_COMMUNITY): Payer: Self-pay | Admitting: Ophthalmology

## 2017-05-25 NOTE — Progress Notes (Signed)
REVIEWED-NO ADDITIONAL RECOMMENDATIONS. 

## 2017-07-28 ENCOUNTER — Ambulatory Visit (INDEPENDENT_AMBULATORY_CARE_PROVIDER_SITE_OTHER): Payer: BLUE CROSS/BLUE SHIELD | Admitting: Cardiovascular Disease

## 2017-07-28 ENCOUNTER — Other Ambulatory Visit (HOSPITAL_COMMUNITY)
Admission: RE | Admit: 2017-07-28 | Discharge: 2017-07-28 | Disposition: A | Discharge status: Home / Self Care | Payer: BLUE CROSS/BLUE SHIELD | Source: Ambulatory Visit | Attending: Cardiovascular Disease | Admitting: Cardiovascular Disease

## 2017-07-28 ENCOUNTER — Encounter: Payer: Self-pay | Admitting: Cardiovascular Disease

## 2017-07-28 VITALS — BP 114/78 | HR 86 | Ht 72.0 in | Wt 233.0 lb

## 2017-07-28 DIAGNOSIS — R21 Rash and other nonspecific skin eruption: Secondary | ICD-10-CM

## 2017-07-28 DIAGNOSIS — Z8679 Personal history of other diseases of the circulatory system: Secondary | ICD-10-CM | POA: Diagnosis not present

## 2017-07-28 DIAGNOSIS — I712 Thoracic aortic aneurysm, without rupture, unspecified: Secondary | ICD-10-CM

## 2017-07-28 DIAGNOSIS — Z9889 Other specified postprocedural states: Secondary | ICD-10-CM

## 2017-07-28 LAB — BASIC METABOLIC PANEL
Anion gap: 8 (ref 5–15)
BUN: 27 mg/dL — ABNORMAL HIGH (ref 6–20)
CHLORIDE: 106 mmol/L (ref 101–111)
CO2: 24 mmol/L (ref 22–32)
CREATININE: 1.35 mg/dL — AB (ref 0.61–1.24)
Calcium: 9.1 mg/dL (ref 8.9–10.3)
GFR calc non Af Amer: 51 mL/min — ABNORMAL LOW (ref >60)
GFR, EST AFRICAN AMERICAN: 59 mL/min — AB (ref >60)
Glucose, Bld: 105 mg/dL — ABNORMAL HIGH (ref 65–99)
POTASSIUM: 3.9 mmol/L (ref 3.5–5.1)
Sodium: 138 mmol/L (ref 135–145)

## 2017-07-28 NOTE — Progress Notes (Signed)
CARDIOLOGY CONSULT NOTE  Patient ID: RUFFIN LADA MRN: [id number] DOB/AGE: [birth year] 71 y.o.  Admit date: (Not on file) Primary Physician: Sharilyn Sites, MD Referring Physician: Charleen Kirks  Reason for Consultation: vascular disease  HPI: Brett Gray is a 71 y.o. male who is being seen today for the evaluation of vascular disease at the request of Hiram Comber, *.   He has a history of an endovascular repair with thoracic stent graft for an acute aortic transection after he fell from a ladder on 07/25/2010. CT angiogram of the chest on 10/07/14 showed that the stent graft was stable.  ECG performed in the office today which I ordered and personally interpreted demonstrates normal sinus rhythm with no ischemic ST segment or T-wave abnormalities, nor any arrhythmias.  Recent labs performed on 06/28/17 demonstrated the following: Movement 13.8, platelets 195, total cholesterol 143, triglyceride 147, HDL 42, LDL 72, BUN 19, creatinine 1.16.  The patient denies any symptoms of chest pain, palpitations, shortness of breath, lightheadedness, dizziness, leg swelling, orthopnea, PND, and syncope.  He tells me the accident occurred when he was standing on a 15 foot ladder. He had chopped down a limb as he was working on an [employer], commercial. The limb fell and split and flew up at him and hit him in the chest. He then fell 15 feet to the ground.    No Known Allergies  Current Outpatient Prescriptions  Medication Sig Dispense Refill  . aspirin 81 MG tablet Take 81 mg by mouth daily.      Marland Kitchen levothyroxine (SYNTHROID, LEVOTHROID) 100 MCG tablet Take 125 mcg by mouth daily.     . Multiple Vitamin (MULTIVITAMIN WITH MINERALS) TABS Take 1 tablet by mouth daily.    . naproxen (NAPROSYN) 500 MG tablet Take 500 mg by mouth daily.    . NON FORMULARY Ducosate sodium  100 mg     Takes 2 tablets daily    . NON FORMULARY Phillips tablets    One tablet daily    . simvastatin  (ZOCOR) 40 MG tablet Take 1 tablet by mouth daily.      No current facility-administered medications for this visit.     Past Medical History:  Diagnosis Date  . Arthritis   . Hernia, inguinal, bilateral   . Hyperlipidemia   . Hypothyroidism     Past Surgical History:  Procedure Laterality Date  . Aortic catheter     percutaneous endovascular repair of thoracic aortic transection  . BACK SURGERY    . CHOLECYSTECTOMY  09/20/2012   Procedure: LAPAROSCOPIC CHOLECYSTECTOMY;  Surgeon: Jamesetta So, MD;  Location: [facility];  Service: General;  Laterality: N/A;  . COLONOSCOPY N/A 06/26/2014   Procedure: COLONOSCOPY;  Surgeon: Danie Binder, MD;  Location: [facility];  Service: Endoscopy;  Laterality: N/A;  1115-rescheduled to [place] notified pt  . FRACTURE SURGERY    . INGUINAL HERNIA REPAIR  2002   bilateral  . SPINE SURGERY    . WRIST FRACTURE SURGERY    . YAG LASER APPLICATION Left 03/23/6811   Procedure: YAG LASER APPLICATION;  Surgeon: Rutherford Guys, MD;  Location: [facility];  Service: Ophthalmology;  Laterality: Left;    Social History   Social History  . Marital status: Married    Spouse name: N/A  . Number of children: N/A  . Years of education: N/A   Occupational History  . Not on file.   Social History Main  Topics  . Smoking status: Never Smoker  . Smokeless tobacco: Never Used     Comment: Never smoked  . Alcohol use No  . Drug use: No  . Sexual activity: Yes    Birth control/ protection: Post-menopausal   Other Topics Concern  . Not on file   Social History Narrative  . No narrative on file     No family history of premature CAD in 1st degree relatives.  Current Meds  Medication Sig  . aspirin 81 MG tablet Take 81 mg by mouth daily.    Marland Kitchen levothyroxine (SYNTHROID, LEVOTHROID) 100 MCG tablet Take 125 mcg by mouth daily.   . Multiple Vitamin (MULTIVITAMIN WITH MINERALS) TABS Take 1 tablet by mouth daily.  . naproxen (NAPROSYN) 500 MG tablet Take  500 mg by mouth daily.  . NON FORMULARY Ducosate sodium  100 mg     Takes 2 tablets daily  . NON FORMULARY Phillips tablets    One tablet daily  . simvastatin (ZOCOR) 40 MG tablet Take 1 tablet by mouth daily.       Review of systems complete and found to be negative unless listed above in HPI    Physical exam Blood pressure 114/78, pulse 86, height 6' (1.829 m), weight 233 lb (105.7 kg), SpO2 95 %. General: NAD Neck: No JVD, no thyromegaly or thyroid nodule.  Lungs: Clear to auscultation bilaterally with normal respiratory effort. CV: Nondisplaced PMI. Regular rate and rhythm, normal S1/S2, no S3/S4, no murmur.  No peripheral edema.  No carotid bruit.    Abdomen: Soft, nontender, no distention.  Skin: Erythema over left toes and medial aspect of heel.  Neurologic: Alert and oriented x 3.  Psych: Normal affect. Extremities: No clubbing or cyanosis.  HEENT: Normal.   ECG: Most recent ECG reviewed.   Labs: Lab Results  Component Value Date/Time   K 3.8 09/21/2012 05:28 AM   BUN 22 10/01/2014 04:48 PM   CREATININE 1.08 10/01/2014 04:48 PM   ALT 22 09/20/2012 05:08 AM   HGB 10.7 (L) 09/21/2012 05:28 AM     Lipids: No results found for: LDLCALC, LDLDIRECT, CHOL, TRIG, HDL      ASSESSMENT AND PLAN:  1. Thoracic stent graft for aortic transection: I will obtain a CT angiogram of the chest to evaluate stent stability. He can follow-up with Dr. Trula Slade.  2. Left foot rash: This appears to be a contact dermatitis. He likely needs a steroid cream.     Disposition: Follow up with Dr. Trula Slade.   Signed: Kate Sable, M.D., F.A.C.C.  07/28/2017, 2:51 PM

## 2017-07-28 NOTE — Patient Instructions (Signed)
Your physician recommends that you schedule a follow-up appointment in: as needed  Please schedule Chest CT Angio, get lab work today:BMET    You have been referred to Dr.Brabham at [facility] for follow up    Your physician recommends that you continue on your current medications as directed. Please refer to the Current Medication list given to you today.       Thank you for choosing [facility] !

## 2017-08-10 ENCOUNTER — Ambulatory Visit (HOSPITAL_COMMUNITY)
Admission: RE | Admit: 2017-08-10 | Discharge: 2017-08-10 | Disposition: A | Discharge status: Home / Self Care | Payer: BLUE CROSS/BLUE SHIELD | Source: Ambulatory Visit | Attending: Cardiovascular Disease | Admitting: Cardiovascular Disease

## 2017-08-10 DIAGNOSIS — Z9889 Other specified postprocedural states: Secondary | ICD-10-CM | POA: Diagnosis not present

## 2017-08-10 DIAGNOSIS — I712 Thoracic aortic aneurysm, without rupture, unspecified: Secondary | ICD-10-CM

## 2017-08-10 DIAGNOSIS — Z8679 Personal history of other diseases of the circulatory system: Secondary | ICD-10-CM | POA: Diagnosis not present

## 2017-08-10 MED ORDER — IOPAMIDOL (ISOVUE-370) INJECTION 76%
100.0000 mL | Freq: Once | INTRAVENOUS | Status: AC | PRN
  Administered 2017-08-10: 100 mL via INTRAVENOUS

## 2017-09-12 ENCOUNTER — Ambulatory Visit: Payer: BLUE CROSS/BLUE SHIELD | Admitting: Surgery

## 2017-10-17 ENCOUNTER — Ambulatory Visit: Payer: BC Managed Care – PPO | Admitting: Surgery

## 2017-12-07 DIAGNOSIS — H524 Presbyopia: Secondary | ICD-10-CM | POA: Diagnosis not present

## 2017-12-07 DIAGNOSIS — Z961 Presence of intraocular lens: Secondary | ICD-10-CM | POA: Diagnosis not present

## 2017-12-07 DIAGNOSIS — H538 Other visual disturbances: Secondary | ICD-10-CM | POA: Diagnosis not present

## 2017-12-07 DIAGNOSIS — H52203 Unspecified astigmatism, bilateral: Secondary | ICD-10-CM | POA: Diagnosis not present

## 2018-01-12 DIAGNOSIS — R69 Illness, unspecified: Secondary | ICD-10-CM | POA: Diagnosis not present

## 2018-01-26 ENCOUNTER — Telehealth: Payer: Self-pay | Admitting: Cardiovascular Disease

## 2018-01-26 NOTE — Telephone Encounter (Signed)
Patient needs a stress test for DOT physical   Call this number first at work [phone]

## 2018-01-26 NOTE — Telephone Encounter (Signed)
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 He was told during his exam that he needed a stress test before DOT exam could be completed. Pt states that he will be out of work after 4/18 if he does not pass DOT exam.

## 2018-01-27 DIAGNOSIS — R0602 Shortness of breath: Secondary | ICD-10-CM | POA: Diagnosis not present

## 2018-01-27 DIAGNOSIS — Z9861 Coronary angioplasty status: Secondary | ICD-10-CM | POA: Diagnosis not present

## 2018-01-27 DIAGNOSIS — R9430 Abnormal result of cardiovascular function study, unspecified: Secondary | ICD-10-CM | POA: Diagnosis not present

## 2018-01-27 DIAGNOSIS — I739 Peripheral vascular disease, unspecified: Secondary | ICD-10-CM | POA: Diagnosis not present

## 2018-01-27 DIAGNOSIS — R079 Chest pain, unspecified: Secondary | ICD-10-CM | POA: Diagnosis not present

## 2018-01-27 DIAGNOSIS — R9431 Abnormal electrocardiogram [ECG] [EKG]: Secondary | ICD-10-CM | POA: Diagnosis not present

## 2018-01-27 DIAGNOSIS — I719 Aortic aneurysm of unspecified site, without rupture: Secondary | ICD-10-CM | POA: Diagnosis not present

## 2018-01-27 NOTE — Telephone Encounter (Signed)
Clarify he can run on a treadmill, if so then order GXT for his DOT physical. Does not need to hold any meds    Zandra Abts MD

## 2018-01-27 NOTE — Telephone Encounter (Signed)
Called pt. His wife stated he is at work. He has already made arrangements for a stress test.

## 2018-01-30 DIAGNOSIS — R079 Chest pain, unspecified: Secondary | ICD-10-CM | POA: Diagnosis not present

## 2018-01-30 DIAGNOSIS — R0602 Shortness of breath: Secondary | ICD-10-CM | POA: Diagnosis not present

## 2018-01-30 DIAGNOSIS — Z9861 Coronary angioplasty status: Secondary | ICD-10-CM | POA: Diagnosis not present

## 2018-01-30 DIAGNOSIS — R9431 Abnormal electrocardiogram [ECG] [EKG]: Secondary | ICD-10-CM | POA: Diagnosis not present

## 2018-01-30 DIAGNOSIS — I739 Peripheral vascular disease, unspecified: Secondary | ICD-10-CM | POA: Diagnosis not present

## 2018-01-30 DIAGNOSIS — I719 Aortic aneurysm of unspecified site, without rupture: Secondary | ICD-10-CM | POA: Diagnosis not present

## 2018-01-30 DIAGNOSIS — R9430 Abnormal result of cardiovascular function study, unspecified: Secondary | ICD-10-CM | POA: Diagnosis not present

## 2018-04-06 DIAGNOSIS — W57XXXA Bitten or stung by nonvenomous insect and other nonvenomous arthropods, initial encounter: Secondary | ICD-10-CM | POA: Diagnosis not present

## 2018-04-06 DIAGNOSIS — L03115 Cellulitis of right lower limb: Secondary | ICD-10-CM | POA: Diagnosis not present

## 2018-04-13 DIAGNOSIS — Z1389 Encounter for screening for other disorder: Secondary | ICD-10-CM | POA: Diagnosis not present

## 2018-04-13 DIAGNOSIS — R600 Localized edema: Secondary | ICD-10-CM | POA: Diagnosis not present

## 2018-04-13 DIAGNOSIS — I872 Venous insufficiency (chronic) (peripheral): Secondary | ICD-10-CM | POA: Diagnosis not present

## 2018-04-13 DIAGNOSIS — E6609 Other obesity due to excess calories: Secondary | ICD-10-CM | POA: Diagnosis not present

## 2018-04-13 DIAGNOSIS — Z6831 Body mass index (BMI) 31.0-31.9, adult: Secondary | ICD-10-CM | POA: Diagnosis not present

## 2018-07-31 DIAGNOSIS — E063 Autoimmune thyroiditis: Secondary | ICD-10-CM | POA: Diagnosis not present

## 2018-07-31 DIAGNOSIS — Z23 Encounter for immunization: Secondary | ICD-10-CM | POA: Diagnosis not present

## 2018-07-31 DIAGNOSIS — Z6831 Body mass index (BMI) 31.0-31.9, adult: Secondary | ICD-10-CM | POA: Diagnosis not present

## 2018-07-31 DIAGNOSIS — Z0001 Encounter for general adult medical examination with abnormal findings: Secondary | ICD-10-CM | POA: Diagnosis not present

## 2018-07-31 DIAGNOSIS — M1991 Primary osteoarthritis, unspecified site: Secondary | ICD-10-CM | POA: Diagnosis not present

## 2018-07-31 DIAGNOSIS — Z0000 Encounter for general adult medical examination without abnormal findings: Secondary | ICD-10-CM | POA: Diagnosis not present

## 2018-07-31 DIAGNOSIS — R7309 Other abnormal glucose: Secondary | ICD-10-CM | POA: Diagnosis not present

## 2018-07-31 DIAGNOSIS — M1711 Unilateral primary osteoarthritis, right knee: Secondary | ICD-10-CM | POA: Diagnosis not present

## 2018-12-12 DIAGNOSIS — H524 Presbyopia: Secondary | ICD-10-CM | POA: Diagnosis not present

## 2018-12-12 DIAGNOSIS — H04123 Dry eye syndrome of bilateral lacrimal glands: Secondary | ICD-10-CM | POA: Diagnosis not present

## 2018-12-12 DIAGNOSIS — H52203 Unspecified astigmatism, bilateral: Secondary | ICD-10-CM | POA: Diagnosis not present

## 2018-12-12 DIAGNOSIS — Z961 Presence of intraocular lens: Secondary | ICD-10-CM | POA: Diagnosis not present

## 2019-03-07 DIAGNOSIS — Z6831 Body mass index (BMI) 31.0-31.9, adult: Secondary | ICD-10-CM | POA: Diagnosis not present

## 2019-03-07 DIAGNOSIS — M2392 Unspecified internal derangement of left knee: Secondary | ICD-10-CM | POA: Diagnosis not present

## 2019-03-07 DIAGNOSIS — M94269 Chondromalacia, unspecified knee: Secondary | ICD-10-CM | POA: Diagnosis not present

## 2019-03-07 DIAGNOSIS — Z1389 Encounter for screening for other disorder: Secondary | ICD-10-CM | POA: Diagnosis not present

## 2019-03-07 DIAGNOSIS — M2391 Unspecified internal derangement of right knee: Secondary | ICD-10-CM | POA: Diagnosis not present

## 2019-03-31 DIAGNOSIS — Z20828 Contact with and (suspected) exposure to other viral communicable diseases: Secondary | ICD-10-CM | POA: Diagnosis not present

## 2019-03-31 DIAGNOSIS — Z1159 Encounter for screening for other viral diseases: Secondary | ICD-10-CM | POA: Diagnosis not present

## 2019-05-02 DIAGNOSIS — L0390 Cellulitis, unspecified: Secondary | ICD-10-CM | POA: Diagnosis not present

## 2019-05-02 DIAGNOSIS — E6609 Other obesity due to excess calories: Secondary | ICD-10-CM | POA: Diagnosis not present

## 2019-05-02 DIAGNOSIS — Z6831 Body mass index (BMI) 31.0-31.9, adult: Secondary | ICD-10-CM | POA: Diagnosis not present

## 2019-05-02 DIAGNOSIS — Z1389 Encounter for screening for other disorder: Secondary | ICD-10-CM | POA: Diagnosis not present

## 2019-05-22 ENCOUNTER — Ambulatory Visit (HOSPITAL_COMMUNITY)
Admission: RE | Admit: 2019-05-22 | Discharge: 2019-05-22 | Disposition: A | Discharge status: Home / Self Care | Payer: Medicare HMO | Source: Ambulatory Visit | Attending: Family Medicine | Admitting: Family Medicine

## 2019-05-22 ENCOUNTER — Other Ambulatory Visit: Payer: Self-pay | Admitting: Family Medicine

## 2019-05-22 ENCOUNTER — Other Ambulatory Visit (HOSPITAL_COMMUNITY): Payer: Self-pay | Admitting: Family Medicine

## 2019-05-22 ENCOUNTER — Other Ambulatory Visit: Payer: Self-pay

## 2019-05-22 DIAGNOSIS — M7989 Other specified soft tissue disorders: Secondary | ICD-10-CM | POA: Diagnosis not present

## 2019-05-22 DIAGNOSIS — Z6831 Body mass index (BMI) 31.0-31.9, adult: Secondary | ICD-10-CM | POA: Diagnosis not present

## 2019-05-22 DIAGNOSIS — A46 Erysipelas: Secondary | ICD-10-CM | POA: Diagnosis not present

## 2019-05-22 DIAGNOSIS — R600 Localized edema: Secondary | ICD-10-CM | POA: Diagnosis not present

## 2019-05-23 DIAGNOSIS — A46 Erysipelas: Secondary | ICD-10-CM | POA: Diagnosis not present

## 2019-05-23 DIAGNOSIS — Z6831 Body mass index (BMI) 31.0-31.9, adult: Secondary | ICD-10-CM | POA: Diagnosis not present

## 2019-05-23 DIAGNOSIS — M7989 Other specified soft tissue disorders: Secondary | ICD-10-CM | POA: Diagnosis not present

## 2019-05-23 DIAGNOSIS — E6609 Other obesity due to excess calories: Secondary | ICD-10-CM | POA: Diagnosis not present

## 2019-05-25 DIAGNOSIS — A46 Erysipelas: Secondary | ICD-10-CM | POA: Diagnosis not present

## 2019-07-02 DIAGNOSIS — Z23 Encounter for immunization: Secondary | ICD-10-CM | POA: Diagnosis not present

## 2019-07-30 DIAGNOSIS — G5731 Lesion of lateral popliteal nerve, right lower limb: Secondary | ICD-10-CM | POA: Diagnosis not present

## 2019-07-30 DIAGNOSIS — E6609 Other obesity due to excess calories: Secondary | ICD-10-CM | POA: Diagnosis not present

## 2019-07-30 DIAGNOSIS — Z6831 Body mass index (BMI) 31.0-31.9, adult: Secondary | ICD-10-CM | POA: Diagnosis not present

## 2019-09-06 DIAGNOSIS — M1711 Unilateral primary osteoarthritis, right knee: Secondary | ICD-10-CM | POA: Diagnosis not present

## 2019-09-06 DIAGNOSIS — E6609 Other obesity due to excess calories: Secondary | ICD-10-CM | POA: Diagnosis not present

## 2019-09-06 DIAGNOSIS — Z6831 Body mass index (BMI) 31.0-31.9, adult: Secondary | ICD-10-CM | POA: Diagnosis not present

## 2019-11-18 DIAGNOSIS — E7849 Other hyperlipidemia: Secondary | ICD-10-CM | POA: Diagnosis not present

## 2019-11-18 DIAGNOSIS — E063 Autoimmune thyroiditis: Secondary | ICD-10-CM | POA: Diagnosis not present

## 2019-11-18 DIAGNOSIS — I2510 Atherosclerotic heart disease of native coronary artery without angina pectoris: Secondary | ICD-10-CM | POA: Diagnosis not present

## 2019-11-27 DIAGNOSIS — E782 Mixed hyperlipidemia: Secondary | ICD-10-CM | POA: Diagnosis not present

## 2019-11-27 DIAGNOSIS — R9431 Abnormal electrocardiogram [ECG] [EKG]: Secondary | ICD-10-CM | POA: Diagnosis not present

## 2019-11-27 DIAGNOSIS — Z9861 Coronary angioplasty status: Secondary | ICD-10-CM | POA: Diagnosis not present

## 2019-12-07 DIAGNOSIS — L0390 Cellulitis, unspecified: Secondary | ICD-10-CM | POA: Diagnosis not present

## 2019-12-07 DIAGNOSIS — I2510 Atherosclerotic heart disease of native coronary artery without angina pectoris: Secondary | ICD-10-CM | POA: Diagnosis not present

## 2019-12-07 DIAGNOSIS — I872 Venous insufficiency (chronic) (peripheral): Secondary | ICD-10-CM | POA: Diagnosis not present

## 2019-12-07 DIAGNOSIS — Q2521 Interruption of aortic arch: Secondary | ICD-10-CM | POA: Diagnosis not present

## 2019-12-07 DIAGNOSIS — R7309 Other abnormal glucose: Secondary | ICD-10-CM | POA: Diagnosis not present

## 2019-12-07 DIAGNOSIS — Z6831 Body mass index (BMI) 31.0-31.9, adult: Secondary | ICD-10-CM | POA: Diagnosis not present

## 2019-12-07 DIAGNOSIS — E063 Autoimmune thyroiditis: Secondary | ICD-10-CM | POA: Diagnosis not present

## 2019-12-10 DIAGNOSIS — Z6830 Body mass index (BMI) 30.0-30.9, adult: Secondary | ICD-10-CM | POA: Diagnosis not present

## 2019-12-10 DIAGNOSIS — R600 Localized edema: Secondary | ICD-10-CM | POA: Diagnosis not present

## 2019-12-10 DIAGNOSIS — L0390 Cellulitis, unspecified: Secondary | ICD-10-CM | POA: Diagnosis not present

## 2019-12-10 DIAGNOSIS — E6609 Other obesity due to excess calories: Secondary | ICD-10-CM | POA: Diagnosis not present

## 2019-12-11 DIAGNOSIS — L03115 Cellulitis of right lower limb: Secondary | ICD-10-CM | POA: Diagnosis not present

## 2019-12-12 DIAGNOSIS — Z1389 Encounter for screening for other disorder: Secondary | ICD-10-CM | POA: Diagnosis not present

## 2019-12-12 DIAGNOSIS — Z6830 Body mass index (BMI) 30.0-30.9, adult: Secondary | ICD-10-CM | POA: Diagnosis not present

## 2019-12-12 DIAGNOSIS — E7849 Other hyperlipidemia: Secondary | ICD-10-CM | POA: Diagnosis not present

## 2019-12-12 DIAGNOSIS — Z0000 Encounter for general adult medical examination without abnormal findings: Secondary | ICD-10-CM | POA: Diagnosis not present

## 2019-12-12 DIAGNOSIS — Z0001 Encounter for general adult medical examination with abnormal findings: Secondary | ICD-10-CM | POA: Diagnosis not present

## 2019-12-12 DIAGNOSIS — R7309 Other abnormal glucose: Secondary | ICD-10-CM | POA: Diagnosis not present

## 2019-12-12 DIAGNOSIS — I872 Venous insufficiency (chronic) (peripheral): Secondary | ICD-10-CM | POA: Diagnosis not present

## 2019-12-12 DIAGNOSIS — E6609 Other obesity due to excess calories: Secondary | ICD-10-CM | POA: Diagnosis not present

## 2019-12-13 DIAGNOSIS — Z0100 Encounter for examination of eyes and vision without abnormal findings: Secondary | ICD-10-CM | POA: Diagnosis not present

## 2019-12-13 DIAGNOSIS — H52203 Unspecified astigmatism, bilateral: Secondary | ICD-10-CM | POA: Diagnosis not present

## 2019-12-13 DIAGNOSIS — Z961 Presence of intraocular lens: Secondary | ICD-10-CM | POA: Diagnosis not present

## 2019-12-13 DIAGNOSIS — H524 Presbyopia: Secondary | ICD-10-CM | POA: Diagnosis not present

## 2019-12-16 DIAGNOSIS — E7849 Other hyperlipidemia: Secondary | ICD-10-CM | POA: Diagnosis not present

## 2019-12-16 DIAGNOSIS — E063 Autoimmune thyroiditis: Secondary | ICD-10-CM | POA: Diagnosis not present

## 2019-12-16 DIAGNOSIS — I2510 Atherosclerotic heart disease of native coronary artery without angina pectoris: Secondary | ICD-10-CM | POA: Diagnosis not present

## 2019-12-31 DIAGNOSIS — E6609 Other obesity due to excess calories: Secondary | ICD-10-CM | POA: Diagnosis not present

## 2019-12-31 DIAGNOSIS — Z7189 Other specified counseling: Secondary | ICD-10-CM | POA: Diagnosis not present

## 2019-12-31 DIAGNOSIS — E559 Vitamin D deficiency, unspecified: Secondary | ICD-10-CM | POA: Diagnosis not present

## 2019-12-31 DIAGNOSIS — Z79899 Other long term (current) drug therapy: Secondary | ICD-10-CM | POA: Diagnosis not present

## 2019-12-31 DIAGNOSIS — N1832 Chronic kidney disease, stage 3b: Secondary | ICD-10-CM | POA: Diagnosis not present

## 2019-12-31 DIAGNOSIS — E785 Hyperlipidemia, unspecified: Secondary | ICD-10-CM | POA: Diagnosis not present

## 2020-01-03 ENCOUNTER — Other Ambulatory Visit (HOSPITAL_COMMUNITY): Payer: Self-pay | Admitting: Nephrology

## 2020-01-03 DIAGNOSIS — E559 Vitamin D deficiency, unspecified: Secondary | ICD-10-CM

## 2020-01-03 DIAGNOSIS — N1832 Chronic kidney disease, stage 3b: Secondary | ICD-10-CM

## 2020-01-07 DIAGNOSIS — N1832 Chronic kidney disease, stage 3b: Secondary | ICD-10-CM | POA: Diagnosis not present

## 2020-01-07 DIAGNOSIS — E559 Vitamin D deficiency, unspecified: Secondary | ICD-10-CM | POA: Diagnosis not present

## 2020-01-07 DIAGNOSIS — E785 Hyperlipidemia, unspecified: Secondary | ICD-10-CM | POA: Diagnosis not present

## 2020-01-07 DIAGNOSIS — Z79899 Other long term (current) drug therapy: Secondary | ICD-10-CM | POA: Diagnosis not present

## 2020-01-10 ENCOUNTER — Other Ambulatory Visit: Payer: Self-pay

## 2020-01-10 ENCOUNTER — Ambulatory Visit (HOSPITAL_COMMUNITY)
Admission: RE | Admit: 2020-01-10 | Discharge: 2020-01-10 | Disposition: A | Discharge status: Home / Self Care | Payer: Medicare HMO | Source: Ambulatory Visit | Attending: Nephrology | Admitting: Nephrology

## 2020-01-10 DIAGNOSIS — N189 Chronic kidney disease, unspecified: Secondary | ICD-10-CM | POA: Diagnosis not present

## 2020-01-10 DIAGNOSIS — N1832 Chronic kidney disease, stage 3b: Secondary | ICD-10-CM | POA: Diagnosis not present

## 2020-01-10 DIAGNOSIS — E559 Vitamin D deficiency, unspecified: Secondary | ICD-10-CM | POA: Insufficient documentation

## 2020-01-10 DIAGNOSIS — N281 Cyst of kidney, acquired: Secondary | ICD-10-CM | POA: Diagnosis not present

## 2020-01-31 DIAGNOSIS — E872 Acidosis: Secondary | ICD-10-CM | POA: Diagnosis not present

## 2020-01-31 DIAGNOSIS — E6609 Other obesity due to excess calories: Secondary | ICD-10-CM | POA: Diagnosis not present

## 2020-01-31 DIAGNOSIS — N281 Cyst of kidney, acquired: Secondary | ICD-10-CM | POA: Diagnosis not present

## 2020-01-31 DIAGNOSIS — E559 Vitamin D deficiency, unspecified: Secondary | ICD-10-CM | POA: Diagnosis not present

## 2020-01-31 DIAGNOSIS — N170 Acute kidney failure with tubular necrosis: Secondary | ICD-10-CM | POA: Diagnosis not present

## 2020-01-31 DIAGNOSIS — R7303 Prediabetes: Secondary | ICD-10-CM | POA: Diagnosis not present

## 2020-01-31 DIAGNOSIS — Z79899 Other long term (current) drug therapy: Secondary | ICD-10-CM | POA: Diagnosis not present

## 2020-01-31 DIAGNOSIS — N1831 Chronic kidney disease, stage 3a: Secondary | ICD-10-CM | POA: Diagnosis not present

## 2020-01-31 DIAGNOSIS — R030 Elevated blood-pressure reading, without diagnosis of hypertension: Secondary | ICD-10-CM | POA: Diagnosis not present

## 2020-04-16 DIAGNOSIS — R69 Illness, unspecified: Secondary | ICD-10-CM | POA: Diagnosis not present

## 2020-05-01 DIAGNOSIS — N170 Acute kidney failure with tubular necrosis: Secondary | ICD-10-CM | POA: Diagnosis not present

## 2020-05-01 DIAGNOSIS — Z79899 Other long term (current) drug therapy: Secondary | ICD-10-CM | POA: Diagnosis not present

## 2020-05-01 DIAGNOSIS — R7303 Prediabetes: Secondary | ICD-10-CM | POA: Diagnosis not present

## 2020-05-01 DIAGNOSIS — Z125 Encounter for screening for malignant neoplasm of prostate: Secondary | ICD-10-CM | POA: Diagnosis not present

## 2020-05-01 DIAGNOSIS — N1831 Chronic kidney disease, stage 3a: Secondary | ICD-10-CM | POA: Diagnosis not present

## 2020-05-01 DIAGNOSIS — Z0000 Encounter for general adult medical examination without abnormal findings: Secondary | ICD-10-CM | POA: Diagnosis not present

## 2020-05-01 DIAGNOSIS — E6609 Other obesity due to excess calories: Secondary | ICD-10-CM | POA: Diagnosis not present

## 2020-05-01 DIAGNOSIS — N281 Cyst of kidney, acquired: Secondary | ICD-10-CM | POA: Diagnosis not present

## 2020-05-01 DIAGNOSIS — E559 Vitamin D deficiency, unspecified: Secondary | ICD-10-CM | POA: Diagnosis not present

## 2020-05-02 DIAGNOSIS — E6609 Other obesity due to excess calories: Secondary | ICD-10-CM | POA: Diagnosis not present

## 2020-05-02 DIAGNOSIS — E872 Acidosis: Secondary | ICD-10-CM | POA: Diagnosis not present

## 2020-05-02 DIAGNOSIS — D638 Anemia in other chronic diseases classified elsewhere: Secondary | ICD-10-CM | POA: Diagnosis not present

## 2020-05-02 DIAGNOSIS — N1831 Chronic kidney disease, stage 3a: Secondary | ICD-10-CM | POA: Diagnosis not present

## 2020-08-05 DIAGNOSIS — N1831 Chronic kidney disease, stage 3a: Secondary | ICD-10-CM | POA: Diagnosis not present

## 2020-08-05 DIAGNOSIS — E872 Acidosis: Secondary | ICD-10-CM | POA: Diagnosis not present

## 2020-08-05 DIAGNOSIS — E6609 Other obesity due to excess calories: Secondary | ICD-10-CM | POA: Diagnosis not present

## 2020-08-05 DIAGNOSIS — D638 Anemia in other chronic diseases classified elsewhere: Secondary | ICD-10-CM | POA: Diagnosis not present

## 2020-08-06 DIAGNOSIS — E6609 Other obesity due to excess calories: Secondary | ICD-10-CM | POA: Diagnosis not present

## 2020-08-06 DIAGNOSIS — N1831 Chronic kidney disease, stage 3a: Secondary | ICD-10-CM | POA: Diagnosis not present

## 2020-08-06 DIAGNOSIS — R7303 Prediabetes: Secondary | ICD-10-CM | POA: Diagnosis not present

## 2020-08-06 DIAGNOSIS — R030 Elevated blood-pressure reading, without diagnosis of hypertension: Secondary | ICD-10-CM | POA: Diagnosis not present

## 2020-08-09 DIAGNOSIS — R030 Elevated blood-pressure reading, without diagnosis of hypertension: Secondary | ICD-10-CM | POA: Diagnosis not present

## 2020-08-09 DIAGNOSIS — E785 Hyperlipidemia, unspecified: Secondary | ICD-10-CM | POA: Diagnosis not present

## 2020-08-09 DIAGNOSIS — Z008 Encounter for other general examination: Secondary | ICD-10-CM | POA: Diagnosis not present

## 2020-08-09 DIAGNOSIS — E669 Obesity, unspecified: Secondary | ICD-10-CM | POA: Diagnosis not present

## 2020-08-09 DIAGNOSIS — Z809 Family history of malignant neoplasm, unspecified: Secondary | ICD-10-CM | POA: Diagnosis not present

## 2020-08-09 DIAGNOSIS — Z6830 Body mass index (BMI) 30.0-30.9, adult: Secondary | ICD-10-CM | POA: Diagnosis not present

## 2020-08-09 DIAGNOSIS — I2510 Atherosclerotic heart disease of native coronary artery without angina pectoris: Secondary | ICD-10-CM | POA: Diagnosis not present

## 2020-08-09 DIAGNOSIS — N189 Chronic kidney disease, unspecified: Secondary | ICD-10-CM | POA: Diagnosis not present

## 2020-08-09 DIAGNOSIS — E039 Hypothyroidism, unspecified: Secondary | ICD-10-CM | POA: Diagnosis not present

## 2020-08-09 DIAGNOSIS — Z7982 Long term (current) use of aspirin: Secondary | ICD-10-CM | POA: Diagnosis not present

## 2020-08-09 DIAGNOSIS — I739 Peripheral vascular disease, unspecified: Secondary | ICD-10-CM | POA: Diagnosis not present

## 2020-09-01 DIAGNOSIS — R69 Illness, unspecified: Secondary | ICD-10-CM | POA: Diagnosis not present

## 2020-09-29 DIAGNOSIS — M2391 Unspecified internal derangement of right knee: Secondary | ICD-10-CM | POA: Diagnosis not present

## 2020-09-29 DIAGNOSIS — M5416 Radiculopathy, lumbar region: Secondary | ICD-10-CM | POA: Diagnosis not present

## 2020-09-29 DIAGNOSIS — M9905 Segmental and somatic dysfunction of pelvic region: Secondary | ICD-10-CM | POA: Diagnosis not present

## 2020-09-29 DIAGNOSIS — M9903 Segmental and somatic dysfunction of lumbar region: Secondary | ICD-10-CM | POA: Diagnosis not present

## 2020-09-29 DIAGNOSIS — M25562 Pain in left knee: Secondary | ICD-10-CM | POA: Diagnosis not present

## 2020-09-29 DIAGNOSIS — M2392 Unspecified internal derangement of left knee: Secondary | ICD-10-CM | POA: Diagnosis not present

## 2020-09-29 DIAGNOSIS — M5451 Vertebrogenic low back pain: Secondary | ICD-10-CM | POA: Diagnosis not present

## 2020-09-29 DIAGNOSIS — M62838 Other muscle spasm: Secondary | ICD-10-CM | POA: Diagnosis not present

## 2020-09-29 DIAGNOSIS — M25561 Pain in right knee: Secondary | ICD-10-CM | POA: Diagnosis not present

## 2020-09-29 DIAGNOSIS — M9904 Segmental and somatic dysfunction of sacral region: Secondary | ICD-10-CM | POA: Diagnosis not present

## 2020-09-30 DIAGNOSIS — M2392 Unspecified internal derangement of left knee: Secondary | ICD-10-CM | POA: Diagnosis not present

## 2020-09-30 DIAGNOSIS — M62838 Other muscle spasm: Secondary | ICD-10-CM | POA: Diagnosis not present

## 2020-09-30 DIAGNOSIS — M5136 Other intervertebral disc degeneration, lumbar region: Secondary | ICD-10-CM | POA: Diagnosis not present

## 2020-09-30 DIAGNOSIS — M25561 Pain in right knee: Secondary | ICD-10-CM | POA: Diagnosis not present

## 2020-09-30 DIAGNOSIS — M9905 Segmental and somatic dysfunction of pelvic region: Secondary | ICD-10-CM | POA: Diagnosis not present

## 2020-09-30 DIAGNOSIS — M9903 Segmental and somatic dysfunction of lumbar region: Secondary | ICD-10-CM | POA: Diagnosis not present

## 2020-09-30 DIAGNOSIS — M2391 Unspecified internal derangement of right knee: Secondary | ICD-10-CM | POA: Diagnosis not present

## 2020-09-30 DIAGNOSIS — M5451 Vertebrogenic low back pain: Secondary | ICD-10-CM | POA: Diagnosis not present

## 2020-09-30 DIAGNOSIS — M9904 Segmental and somatic dysfunction of sacral region: Secondary | ICD-10-CM | POA: Diagnosis not present

## 2020-09-30 DIAGNOSIS — M5137 Other intervertebral disc degeneration, lumbosacral region: Secondary | ICD-10-CM | POA: Diagnosis not present

## 2020-09-30 DIAGNOSIS — M25562 Pain in left knee: Secondary | ICD-10-CM | POA: Diagnosis not present

## 2020-10-02 DIAGNOSIS — M2392 Unspecified internal derangement of left knee: Secondary | ICD-10-CM | POA: Diagnosis not present

## 2020-10-02 DIAGNOSIS — M62838 Other muscle spasm: Secondary | ICD-10-CM | POA: Diagnosis not present

## 2020-10-02 DIAGNOSIS — M2391 Unspecified internal derangement of right knee: Secondary | ICD-10-CM | POA: Diagnosis not present

## 2020-10-02 DIAGNOSIS — M9904 Segmental and somatic dysfunction of sacral region: Secondary | ICD-10-CM | POA: Diagnosis not present

## 2020-10-02 DIAGNOSIS — M5137 Other intervertebral disc degeneration, lumbosacral region: Secondary | ICD-10-CM | POA: Diagnosis not present

## 2020-10-02 DIAGNOSIS — M9905 Segmental and somatic dysfunction of pelvic region: Secondary | ICD-10-CM | POA: Diagnosis not present

## 2020-10-02 DIAGNOSIS — M5136 Other intervertebral disc degeneration, lumbar region: Secondary | ICD-10-CM | POA: Diagnosis not present

## 2020-10-02 DIAGNOSIS — M9903 Segmental and somatic dysfunction of lumbar region: Secondary | ICD-10-CM | POA: Diagnosis not present

## 2020-10-07 DIAGNOSIS — M5137 Other intervertebral disc degeneration, lumbosacral region: Secondary | ICD-10-CM | POA: Diagnosis not present

## 2020-10-07 DIAGNOSIS — M9904 Segmental and somatic dysfunction of sacral region: Secondary | ICD-10-CM | POA: Diagnosis not present

## 2020-10-07 DIAGNOSIS — M62838 Other muscle spasm: Secondary | ICD-10-CM | POA: Diagnosis not present

## 2020-10-07 DIAGNOSIS — M9905 Segmental and somatic dysfunction of pelvic region: Secondary | ICD-10-CM | POA: Diagnosis not present

## 2020-10-07 DIAGNOSIS — M9903 Segmental and somatic dysfunction of lumbar region: Secondary | ICD-10-CM | POA: Diagnosis not present

## 2020-10-07 DIAGNOSIS — M2391 Unspecified internal derangement of right knee: Secondary | ICD-10-CM | POA: Diagnosis not present

## 2020-10-07 DIAGNOSIS — M2392 Unspecified internal derangement of left knee: Secondary | ICD-10-CM | POA: Diagnosis not present

## 2020-10-07 DIAGNOSIS — M5136 Other intervertebral disc degeneration, lumbar region: Secondary | ICD-10-CM | POA: Diagnosis not present

## 2020-10-09 DIAGNOSIS — M9904 Segmental and somatic dysfunction of sacral region: Secondary | ICD-10-CM | POA: Diagnosis not present

## 2020-10-09 DIAGNOSIS — M2391 Unspecified internal derangement of right knee: Secondary | ICD-10-CM | POA: Diagnosis not present

## 2020-10-09 DIAGNOSIS — M9905 Segmental and somatic dysfunction of pelvic region: Secondary | ICD-10-CM | POA: Diagnosis not present

## 2020-10-09 DIAGNOSIS — M62838 Other muscle spasm: Secondary | ICD-10-CM | POA: Diagnosis not present

## 2020-10-09 DIAGNOSIS — M5137 Other intervertebral disc degeneration, lumbosacral region: Secondary | ICD-10-CM | POA: Diagnosis not present

## 2020-10-09 DIAGNOSIS — M9903 Segmental and somatic dysfunction of lumbar region: Secondary | ICD-10-CM | POA: Diagnosis not present

## 2020-10-09 DIAGNOSIS — M5136 Other intervertebral disc degeneration, lumbar region: Secondary | ICD-10-CM | POA: Diagnosis not present

## 2020-10-09 DIAGNOSIS — M2392 Unspecified internal derangement of left knee: Secondary | ICD-10-CM | POA: Diagnosis not present

## 2020-10-14 DIAGNOSIS — M5136 Other intervertebral disc degeneration, lumbar region: Secondary | ICD-10-CM | POA: Diagnosis not present

## 2020-10-14 DIAGNOSIS — M2392 Unspecified internal derangement of left knee: Secondary | ICD-10-CM | POA: Diagnosis not present

## 2020-10-14 DIAGNOSIS — M9903 Segmental and somatic dysfunction of lumbar region: Secondary | ICD-10-CM | POA: Diagnosis not present

## 2020-10-14 DIAGNOSIS — M62838 Other muscle spasm: Secondary | ICD-10-CM | POA: Diagnosis not present

## 2020-10-14 DIAGNOSIS — M9905 Segmental and somatic dysfunction of pelvic region: Secondary | ICD-10-CM | POA: Diagnosis not present

## 2020-10-14 DIAGNOSIS — M5416 Radiculopathy, lumbar region: Secondary | ICD-10-CM | POA: Diagnosis not present

## 2020-10-14 DIAGNOSIS — M2391 Unspecified internal derangement of right knee: Secondary | ICD-10-CM | POA: Diagnosis not present

## 2020-10-14 DIAGNOSIS — M5137 Other intervertebral disc degeneration, lumbosacral region: Secondary | ICD-10-CM | POA: Diagnosis not present

## 2020-10-16 DIAGNOSIS — M5136 Other intervertebral disc degeneration, lumbar region: Secondary | ICD-10-CM | POA: Diagnosis not present

## 2020-10-16 DIAGNOSIS — M62838 Other muscle spasm: Secondary | ICD-10-CM | POA: Diagnosis not present

## 2020-10-16 DIAGNOSIS — M9903 Segmental and somatic dysfunction of lumbar region: Secondary | ICD-10-CM | POA: Diagnosis not present

## 2020-10-16 DIAGNOSIS — M5416 Radiculopathy, lumbar region: Secondary | ICD-10-CM | POA: Diagnosis not present

## 2020-10-16 DIAGNOSIS — M2392 Unspecified internal derangement of left knee: Secondary | ICD-10-CM | POA: Diagnosis not present

## 2020-10-16 DIAGNOSIS — M9905 Segmental and somatic dysfunction of pelvic region: Secondary | ICD-10-CM | POA: Diagnosis not present

## 2020-10-16 DIAGNOSIS — M5137 Other intervertebral disc degeneration, lumbosacral region: Secondary | ICD-10-CM | POA: Diagnosis not present

## 2020-10-16 DIAGNOSIS — M2391 Unspecified internal derangement of right knee: Secondary | ICD-10-CM | POA: Diagnosis not present

## 2020-10-21 DIAGNOSIS — M2391 Unspecified internal derangement of right knee: Secondary | ICD-10-CM | POA: Diagnosis not present

## 2020-10-21 DIAGNOSIS — M5416 Radiculopathy, lumbar region: Secondary | ICD-10-CM | POA: Diagnosis not present

## 2020-10-21 DIAGNOSIS — M62838 Other muscle spasm: Secondary | ICD-10-CM | POA: Diagnosis not present

## 2020-10-21 DIAGNOSIS — M5137 Other intervertebral disc degeneration, lumbosacral region: Secondary | ICD-10-CM | POA: Diagnosis not present

## 2020-10-21 DIAGNOSIS — M9903 Segmental and somatic dysfunction of lumbar region: Secondary | ICD-10-CM | POA: Diagnosis not present

## 2020-10-21 DIAGNOSIS — M9905 Segmental and somatic dysfunction of pelvic region: Secondary | ICD-10-CM | POA: Diagnosis not present

## 2020-10-21 DIAGNOSIS — M2392 Unspecified internal derangement of left knee: Secondary | ICD-10-CM | POA: Diagnosis not present

## 2020-10-21 DIAGNOSIS — M5136 Other intervertebral disc degeneration, lumbar region: Secondary | ICD-10-CM | POA: Diagnosis not present

## 2020-10-28 DIAGNOSIS — M9905 Segmental and somatic dysfunction of pelvic region: Secondary | ICD-10-CM | POA: Diagnosis not present

## 2020-10-28 DIAGNOSIS — M62838 Other muscle spasm: Secondary | ICD-10-CM | POA: Diagnosis not present

## 2020-10-28 DIAGNOSIS — M5416 Radiculopathy, lumbar region: Secondary | ICD-10-CM | POA: Diagnosis not present

## 2020-10-28 DIAGNOSIS — M5137 Other intervertebral disc degeneration, lumbosacral region: Secondary | ICD-10-CM | POA: Diagnosis not present

## 2020-10-28 DIAGNOSIS — M2391 Unspecified internal derangement of right knee: Secondary | ICD-10-CM | POA: Diagnosis not present

## 2020-10-28 DIAGNOSIS — M2392 Unspecified internal derangement of left knee: Secondary | ICD-10-CM | POA: Diagnosis not present

## 2020-10-28 DIAGNOSIS — M5136 Other intervertebral disc degeneration, lumbar region: Secondary | ICD-10-CM | POA: Diagnosis not present

## 2020-10-28 DIAGNOSIS — M9903 Segmental and somatic dysfunction of lumbar region: Secondary | ICD-10-CM | POA: Diagnosis not present

## 2020-10-30 DIAGNOSIS — M5136 Other intervertebral disc degeneration, lumbar region: Secondary | ICD-10-CM | POA: Diagnosis not present

## 2020-10-30 DIAGNOSIS — M9903 Segmental and somatic dysfunction of lumbar region: Secondary | ICD-10-CM | POA: Diagnosis not present

## 2020-10-30 DIAGNOSIS — M5137 Other intervertebral disc degeneration, lumbosacral region: Secondary | ICD-10-CM | POA: Diagnosis not present

## 2020-10-30 DIAGNOSIS — M5416 Radiculopathy, lumbar region: Secondary | ICD-10-CM | POA: Diagnosis not present

## 2020-10-30 DIAGNOSIS — M2392 Unspecified internal derangement of left knee: Secondary | ICD-10-CM | POA: Diagnosis not present

## 2020-10-30 DIAGNOSIS — M9905 Segmental and somatic dysfunction of pelvic region: Secondary | ICD-10-CM | POA: Diagnosis not present

## 2020-10-30 DIAGNOSIS — M62838 Other muscle spasm: Secondary | ICD-10-CM | POA: Diagnosis not present

## 2020-10-30 DIAGNOSIS — M2391 Unspecified internal derangement of right knee: Secondary | ICD-10-CM | POA: Diagnosis not present

## 2020-11-05 DIAGNOSIS — M2392 Unspecified internal derangement of left knee: Secondary | ICD-10-CM | POA: Diagnosis not present

## 2020-11-05 DIAGNOSIS — M5416 Radiculopathy, lumbar region: Secondary | ICD-10-CM | POA: Diagnosis not present

## 2020-11-05 DIAGNOSIS — M5136 Other intervertebral disc degeneration, lumbar region: Secondary | ICD-10-CM | POA: Diagnosis not present

## 2020-11-05 DIAGNOSIS — M62838 Other muscle spasm: Secondary | ICD-10-CM | POA: Diagnosis not present

## 2020-11-05 DIAGNOSIS — M2391 Unspecified internal derangement of right knee: Secondary | ICD-10-CM | POA: Diagnosis not present

## 2020-11-05 DIAGNOSIS — M9903 Segmental and somatic dysfunction of lumbar region: Secondary | ICD-10-CM | POA: Diagnosis not present

## 2020-11-05 DIAGNOSIS — M9905 Segmental and somatic dysfunction of pelvic region: Secondary | ICD-10-CM | POA: Diagnosis not present

## 2020-11-05 DIAGNOSIS — M5137 Other intervertebral disc degeneration, lumbosacral region: Secondary | ICD-10-CM | POA: Diagnosis not present

## 2020-11-06 DIAGNOSIS — M5136 Other intervertebral disc degeneration, lumbar region: Secondary | ICD-10-CM | POA: Diagnosis not present

## 2020-11-06 DIAGNOSIS — M5416 Radiculopathy, lumbar region: Secondary | ICD-10-CM | POA: Diagnosis not present

## 2020-11-06 DIAGNOSIS — M5137 Other intervertebral disc degeneration, lumbosacral region: Secondary | ICD-10-CM | POA: Diagnosis not present

## 2020-11-06 DIAGNOSIS — M2391 Unspecified internal derangement of right knee: Secondary | ICD-10-CM | POA: Diagnosis not present

## 2020-11-06 DIAGNOSIS — M2392 Unspecified internal derangement of left knee: Secondary | ICD-10-CM | POA: Diagnosis not present

## 2020-11-06 DIAGNOSIS — M62838 Other muscle spasm: Secondary | ICD-10-CM | POA: Diagnosis not present

## 2020-11-06 DIAGNOSIS — M9905 Segmental and somatic dysfunction of pelvic region: Secondary | ICD-10-CM | POA: Diagnosis not present

## 2020-11-06 DIAGNOSIS — M9903 Segmental and somatic dysfunction of lumbar region: Secondary | ICD-10-CM | POA: Diagnosis not present

## 2020-11-11 DIAGNOSIS — M5416 Radiculopathy, lumbar region: Secondary | ICD-10-CM | POA: Diagnosis not present

## 2020-11-11 DIAGNOSIS — M2391 Unspecified internal derangement of right knee: Secondary | ICD-10-CM | POA: Diagnosis not present

## 2020-11-11 DIAGNOSIS — M9905 Segmental and somatic dysfunction of pelvic region: Secondary | ICD-10-CM | POA: Diagnosis not present

## 2020-11-11 DIAGNOSIS — M2392 Unspecified internal derangement of left knee: Secondary | ICD-10-CM | POA: Diagnosis not present

## 2020-11-11 DIAGNOSIS — M5137 Other intervertebral disc degeneration, lumbosacral region: Secondary | ICD-10-CM | POA: Diagnosis not present

## 2020-11-11 DIAGNOSIS — M62838 Other muscle spasm: Secondary | ICD-10-CM | POA: Diagnosis not present

## 2020-11-11 DIAGNOSIS — M9903 Segmental and somatic dysfunction of lumbar region: Secondary | ICD-10-CM | POA: Diagnosis not present

## 2020-11-11 DIAGNOSIS — M5136 Other intervertebral disc degeneration, lumbar region: Secondary | ICD-10-CM | POA: Diagnosis not present

## 2020-11-13 DIAGNOSIS — M5416 Radiculopathy, lumbar region: Secondary | ICD-10-CM | POA: Diagnosis not present

## 2020-11-13 DIAGNOSIS — M9905 Segmental and somatic dysfunction of pelvic region: Secondary | ICD-10-CM | POA: Diagnosis not present

## 2020-11-13 DIAGNOSIS — M9903 Segmental and somatic dysfunction of lumbar region: Secondary | ICD-10-CM | POA: Diagnosis not present

## 2020-11-13 DIAGNOSIS — M5137 Other intervertebral disc degeneration, lumbosacral region: Secondary | ICD-10-CM | POA: Diagnosis not present

## 2020-11-13 DIAGNOSIS — M2392 Unspecified internal derangement of left knee: Secondary | ICD-10-CM | POA: Diagnosis not present

## 2020-11-13 DIAGNOSIS — M62838 Other muscle spasm: Secondary | ICD-10-CM | POA: Diagnosis not present

## 2020-11-13 DIAGNOSIS — M5136 Other intervertebral disc degeneration, lumbar region: Secondary | ICD-10-CM | POA: Diagnosis not present

## 2020-11-13 DIAGNOSIS — M2391 Unspecified internal derangement of right knee: Secondary | ICD-10-CM | POA: Diagnosis not present

## 2020-11-18 DIAGNOSIS — M62838 Other muscle spasm: Secondary | ICD-10-CM | POA: Diagnosis not present

## 2020-11-18 DIAGNOSIS — M2392 Unspecified internal derangement of left knee: Secondary | ICD-10-CM | POA: Diagnosis not present

## 2020-11-18 DIAGNOSIS — M5136 Other intervertebral disc degeneration, lumbar region: Secondary | ICD-10-CM | POA: Diagnosis not present

## 2020-11-18 DIAGNOSIS — M9903 Segmental and somatic dysfunction of lumbar region: Secondary | ICD-10-CM | POA: Diagnosis not present

## 2020-11-18 DIAGNOSIS — M5416 Radiculopathy, lumbar region: Secondary | ICD-10-CM | POA: Diagnosis not present

## 2020-11-18 DIAGNOSIS — M2391 Unspecified internal derangement of right knee: Secondary | ICD-10-CM | POA: Diagnosis not present

## 2020-11-18 DIAGNOSIS — M9905 Segmental and somatic dysfunction of pelvic region: Secondary | ICD-10-CM | POA: Diagnosis not present

## 2020-11-18 DIAGNOSIS — M5137 Other intervertebral disc degeneration, lumbosacral region: Secondary | ICD-10-CM | POA: Diagnosis not present

## 2020-11-20 DIAGNOSIS — M5416 Radiculopathy, lumbar region: Secondary | ICD-10-CM | POA: Diagnosis not present

## 2020-11-20 DIAGNOSIS — M62838 Other muscle spasm: Secondary | ICD-10-CM | POA: Diagnosis not present

## 2020-11-20 DIAGNOSIS — M2392 Unspecified internal derangement of left knee: Secondary | ICD-10-CM | POA: Diagnosis not present

## 2020-11-20 DIAGNOSIS — M5137 Other intervertebral disc degeneration, lumbosacral region: Secondary | ICD-10-CM | POA: Diagnosis not present

## 2020-11-20 DIAGNOSIS — M2391 Unspecified internal derangement of right knee: Secondary | ICD-10-CM | POA: Diagnosis not present

## 2020-11-20 DIAGNOSIS — M5136 Other intervertebral disc degeneration, lumbar region: Secondary | ICD-10-CM | POA: Diagnosis not present

## 2020-11-20 DIAGNOSIS — M9905 Segmental and somatic dysfunction of pelvic region: Secondary | ICD-10-CM | POA: Diagnosis not present

## 2020-11-20 DIAGNOSIS — M9903 Segmental and somatic dysfunction of lumbar region: Secondary | ICD-10-CM | POA: Diagnosis not present

## 2020-11-25 DIAGNOSIS — M2392 Unspecified internal derangement of left knee: Secondary | ICD-10-CM | POA: Diagnosis not present

## 2020-11-25 DIAGNOSIS — M9903 Segmental and somatic dysfunction of lumbar region: Secondary | ICD-10-CM | POA: Diagnosis not present

## 2020-11-25 DIAGNOSIS — M62838 Other muscle spasm: Secondary | ICD-10-CM | POA: Diagnosis not present

## 2020-11-25 DIAGNOSIS — M5137 Other intervertebral disc degeneration, lumbosacral region: Secondary | ICD-10-CM | POA: Diagnosis not present

## 2020-11-25 DIAGNOSIS — M5416 Radiculopathy, lumbar region: Secondary | ICD-10-CM | POA: Diagnosis not present

## 2020-11-25 DIAGNOSIS — M9905 Segmental and somatic dysfunction of pelvic region: Secondary | ICD-10-CM | POA: Diagnosis not present

## 2020-11-25 DIAGNOSIS — M5136 Other intervertebral disc degeneration, lumbar region: Secondary | ICD-10-CM | POA: Diagnosis not present

## 2020-11-25 DIAGNOSIS — M2391 Unspecified internal derangement of right knee: Secondary | ICD-10-CM | POA: Diagnosis not present

## 2020-11-27 DIAGNOSIS — M62838 Other muscle spasm: Secondary | ICD-10-CM | POA: Diagnosis not present

## 2020-11-27 DIAGNOSIS — M2392 Unspecified internal derangement of left knee: Secondary | ICD-10-CM | POA: Diagnosis not present

## 2020-11-27 DIAGNOSIS — M5137 Other intervertebral disc degeneration, lumbosacral region: Secondary | ICD-10-CM | POA: Diagnosis not present

## 2020-11-27 DIAGNOSIS — M9903 Segmental and somatic dysfunction of lumbar region: Secondary | ICD-10-CM | POA: Diagnosis not present

## 2020-11-27 DIAGNOSIS — M5416 Radiculopathy, lumbar region: Secondary | ICD-10-CM | POA: Diagnosis not present

## 2020-11-27 DIAGNOSIS — M9905 Segmental and somatic dysfunction of pelvic region: Secondary | ICD-10-CM | POA: Diagnosis not present

## 2020-11-27 DIAGNOSIS — M2391 Unspecified internal derangement of right knee: Secondary | ICD-10-CM | POA: Diagnosis not present

## 2020-11-27 DIAGNOSIS — M5136 Other intervertebral disc degeneration, lumbar region: Secondary | ICD-10-CM | POA: Diagnosis not present

## 2020-12-02 DIAGNOSIS — M9903 Segmental and somatic dysfunction of lumbar region: Secondary | ICD-10-CM | POA: Diagnosis not present

## 2020-12-02 DIAGNOSIS — M5416 Radiculopathy, lumbar region: Secondary | ICD-10-CM | POA: Diagnosis not present

## 2020-12-02 DIAGNOSIS — M5136 Other intervertebral disc degeneration, lumbar region: Secondary | ICD-10-CM | POA: Diagnosis not present

## 2020-12-02 DIAGNOSIS — M5137 Other intervertebral disc degeneration, lumbosacral region: Secondary | ICD-10-CM | POA: Diagnosis not present

## 2020-12-02 DIAGNOSIS — M62838 Other muscle spasm: Secondary | ICD-10-CM | POA: Diagnosis not present

## 2020-12-02 DIAGNOSIS — M2392 Unspecified internal derangement of left knee: Secondary | ICD-10-CM | POA: Diagnosis not present

## 2020-12-02 DIAGNOSIS — M9905 Segmental and somatic dysfunction of pelvic region: Secondary | ICD-10-CM | POA: Diagnosis not present

## 2020-12-02 DIAGNOSIS — M2391 Unspecified internal derangement of right knee: Secondary | ICD-10-CM | POA: Diagnosis not present

## 2020-12-04 DIAGNOSIS — M2391 Unspecified internal derangement of right knee: Secondary | ICD-10-CM | POA: Diagnosis not present

## 2020-12-04 DIAGNOSIS — M2392 Unspecified internal derangement of left knee: Secondary | ICD-10-CM | POA: Diagnosis not present

## 2020-12-04 DIAGNOSIS — M9905 Segmental and somatic dysfunction of pelvic region: Secondary | ICD-10-CM | POA: Diagnosis not present

## 2020-12-04 DIAGNOSIS — M9903 Segmental and somatic dysfunction of lumbar region: Secondary | ICD-10-CM | POA: Diagnosis not present

## 2020-12-04 DIAGNOSIS — M5136 Other intervertebral disc degeneration, lumbar region: Secondary | ICD-10-CM | POA: Diagnosis not present

## 2020-12-04 DIAGNOSIS — M5137 Other intervertebral disc degeneration, lumbosacral region: Secondary | ICD-10-CM | POA: Diagnosis not present

## 2020-12-04 DIAGNOSIS — M62838 Other muscle spasm: Secondary | ICD-10-CM | POA: Diagnosis not present

## 2020-12-04 DIAGNOSIS — M5416 Radiculopathy, lumbar region: Secondary | ICD-10-CM | POA: Diagnosis not present

## 2020-12-09 DIAGNOSIS — M2392 Unspecified internal derangement of left knee: Secondary | ICD-10-CM | POA: Diagnosis not present

## 2020-12-09 DIAGNOSIS — M5136 Other intervertebral disc degeneration, lumbar region: Secondary | ICD-10-CM | POA: Diagnosis not present

## 2020-12-09 DIAGNOSIS — M62838 Other muscle spasm: Secondary | ICD-10-CM | POA: Diagnosis not present

## 2020-12-09 DIAGNOSIS — M9905 Segmental and somatic dysfunction of pelvic region: Secondary | ICD-10-CM | POA: Diagnosis not present

## 2020-12-09 DIAGNOSIS — M2391 Unspecified internal derangement of right knee: Secondary | ICD-10-CM | POA: Diagnosis not present

## 2020-12-09 DIAGNOSIS — M9903 Segmental and somatic dysfunction of lumbar region: Secondary | ICD-10-CM | POA: Diagnosis not present

## 2020-12-09 DIAGNOSIS — M5416 Radiculopathy, lumbar region: Secondary | ICD-10-CM | POA: Diagnosis not present

## 2020-12-09 DIAGNOSIS — M5137 Other intervertebral disc degeneration, lumbosacral region: Secondary | ICD-10-CM | POA: Diagnosis not present

## 2020-12-11 DIAGNOSIS — M9905 Segmental and somatic dysfunction of pelvic region: Secondary | ICD-10-CM | POA: Diagnosis not present

## 2020-12-11 DIAGNOSIS — M62838 Other muscle spasm: Secondary | ICD-10-CM | POA: Diagnosis not present

## 2020-12-11 DIAGNOSIS — M9903 Segmental and somatic dysfunction of lumbar region: Secondary | ICD-10-CM | POA: Diagnosis not present

## 2020-12-11 DIAGNOSIS — M2392 Unspecified internal derangement of left knee: Secondary | ICD-10-CM | POA: Diagnosis not present

## 2020-12-11 DIAGNOSIS — M5137 Other intervertebral disc degeneration, lumbosacral region: Secondary | ICD-10-CM | POA: Diagnosis not present

## 2020-12-11 DIAGNOSIS — M5136 Other intervertebral disc degeneration, lumbar region: Secondary | ICD-10-CM | POA: Diagnosis not present

## 2020-12-11 DIAGNOSIS — M2391 Unspecified internal derangement of right knee: Secondary | ICD-10-CM | POA: Diagnosis not present

## 2020-12-11 DIAGNOSIS — M5416 Radiculopathy, lumbar region: Secondary | ICD-10-CM | POA: Diagnosis not present

## 2020-12-15 DIAGNOSIS — H524 Presbyopia: Secondary | ICD-10-CM | POA: Diagnosis not present

## 2020-12-15 DIAGNOSIS — H5213 Myopia, bilateral: Secondary | ICD-10-CM | POA: Diagnosis not present

## 2020-12-15 DIAGNOSIS — H52203 Unspecified astigmatism, bilateral: Secondary | ICD-10-CM | POA: Diagnosis not present

## 2020-12-15 DIAGNOSIS — Z961 Presence of intraocular lens: Secondary | ICD-10-CM | POA: Diagnosis not present

## 2020-12-17 DIAGNOSIS — Z1331 Encounter for screening for depression: Secondary | ICD-10-CM | POA: Diagnosis not present

## 2020-12-17 DIAGNOSIS — R7309 Other abnormal glucose: Secondary | ICD-10-CM | POA: Diagnosis not present

## 2020-12-17 DIAGNOSIS — Z0000 Encounter for general adult medical examination without abnormal findings: Secondary | ICD-10-CM | POA: Diagnosis not present

## 2020-12-17 DIAGNOSIS — Z1389 Encounter for screening for other disorder: Secondary | ICD-10-CM | POA: Diagnosis not present

## 2020-12-17 DIAGNOSIS — Z6830 Body mass index (BMI) 30.0-30.9, adult: Secondary | ICD-10-CM | POA: Diagnosis not present

## 2020-12-17 DIAGNOSIS — E782 Mixed hyperlipidemia: Secondary | ICD-10-CM | POA: Diagnosis not present

## 2020-12-18 DIAGNOSIS — M2392 Unspecified internal derangement of left knee: Secondary | ICD-10-CM | POA: Diagnosis not present

## 2020-12-18 DIAGNOSIS — M5136 Other intervertebral disc degeneration, lumbar region: Secondary | ICD-10-CM | POA: Diagnosis not present

## 2020-12-18 DIAGNOSIS — M9903 Segmental and somatic dysfunction of lumbar region: Secondary | ICD-10-CM | POA: Diagnosis not present

## 2020-12-18 DIAGNOSIS — M62838 Other muscle spasm: Secondary | ICD-10-CM | POA: Diagnosis not present

## 2020-12-18 DIAGNOSIS — M2391 Unspecified internal derangement of right knee: Secondary | ICD-10-CM | POA: Diagnosis not present

## 2020-12-18 DIAGNOSIS — M5137 Other intervertebral disc degeneration, lumbosacral region: Secondary | ICD-10-CM | POA: Diagnosis not present

## 2020-12-18 DIAGNOSIS — M9905 Segmental and somatic dysfunction of pelvic region: Secondary | ICD-10-CM | POA: Diagnosis not present

## 2020-12-18 DIAGNOSIS — M5416 Radiculopathy, lumbar region: Secondary | ICD-10-CM | POA: Diagnosis not present

## 2020-12-23 DIAGNOSIS — M5136 Other intervertebral disc degeneration, lumbar region: Secondary | ICD-10-CM | POA: Diagnosis not present

## 2020-12-23 DIAGNOSIS — M5416 Radiculopathy, lumbar region: Secondary | ICD-10-CM | POA: Diagnosis not present

## 2020-12-23 DIAGNOSIS — M5137 Other intervertebral disc degeneration, lumbosacral region: Secondary | ICD-10-CM | POA: Diagnosis not present

## 2020-12-23 DIAGNOSIS — M2392 Unspecified internal derangement of left knee: Secondary | ICD-10-CM | POA: Diagnosis not present

## 2020-12-23 DIAGNOSIS — M62838 Other muscle spasm: Secondary | ICD-10-CM | POA: Diagnosis not present

## 2020-12-23 DIAGNOSIS — M9905 Segmental and somatic dysfunction of pelvic region: Secondary | ICD-10-CM | POA: Diagnosis not present

## 2020-12-23 DIAGNOSIS — M2391 Unspecified internal derangement of right knee: Secondary | ICD-10-CM | POA: Diagnosis not present

## 2020-12-23 DIAGNOSIS — M9903 Segmental and somatic dysfunction of lumbar region: Secondary | ICD-10-CM | POA: Diagnosis not present

## 2020-12-30 DIAGNOSIS — M5137 Other intervertebral disc degeneration, lumbosacral region: Secondary | ICD-10-CM | POA: Diagnosis not present

## 2020-12-30 DIAGNOSIS — M9905 Segmental and somatic dysfunction of pelvic region: Secondary | ICD-10-CM | POA: Diagnosis not present

## 2020-12-30 DIAGNOSIS — M5416 Radiculopathy, lumbar region: Secondary | ICD-10-CM | POA: Diagnosis not present

## 2020-12-30 DIAGNOSIS — M2391 Unspecified internal derangement of right knee: Secondary | ICD-10-CM | POA: Diagnosis not present

## 2020-12-30 DIAGNOSIS — M2392 Unspecified internal derangement of left knee: Secondary | ICD-10-CM | POA: Diagnosis not present

## 2020-12-30 DIAGNOSIS — M5136 Other intervertebral disc degeneration, lumbar region: Secondary | ICD-10-CM | POA: Diagnosis not present

## 2020-12-30 DIAGNOSIS — M9903 Segmental and somatic dysfunction of lumbar region: Secondary | ICD-10-CM | POA: Diagnosis not present

## 2020-12-30 DIAGNOSIS — M62838 Other muscle spasm: Secondary | ICD-10-CM | POA: Diagnosis not present

## 2021-02-02 DIAGNOSIS — N1831 Chronic kidney disease, stage 3a: Secondary | ICD-10-CM | POA: Diagnosis not present

## 2021-02-02 DIAGNOSIS — R030 Elevated blood-pressure reading, without diagnosis of hypertension: Secondary | ICD-10-CM | POA: Diagnosis not present

## 2021-02-02 DIAGNOSIS — E6609 Other obesity due to excess calories: Secondary | ICD-10-CM | POA: Diagnosis not present

## 2021-02-02 DIAGNOSIS — R7303 Prediabetes: Secondary | ICD-10-CM | POA: Diagnosis not present

## 2021-02-04 DIAGNOSIS — R7303 Prediabetes: Secondary | ICD-10-CM | POA: Diagnosis not present

## 2021-02-04 DIAGNOSIS — R030 Elevated blood-pressure reading, without diagnosis of hypertension: Secondary | ICD-10-CM | POA: Diagnosis not present

## 2021-02-04 DIAGNOSIS — N1831 Chronic kidney disease, stage 3a: Secondary | ICD-10-CM | POA: Diagnosis not present

## 2021-02-04 DIAGNOSIS — E6609 Other obesity due to excess calories: Secondary | ICD-10-CM | POA: Diagnosis not present

## 2021-04-06 DIAGNOSIS — R600 Localized edema: Secondary | ICD-10-CM | POA: Diagnosis not present

## 2021-04-06 DIAGNOSIS — L03119 Cellulitis of unspecified part of limb: Secondary | ICD-10-CM | POA: Diagnosis not present

## 2021-04-06 DIAGNOSIS — Z6832 Body mass index (BMI) 32.0-32.9, adult: Secondary | ICD-10-CM | POA: Diagnosis not present

## 2021-04-06 DIAGNOSIS — E6609 Other obesity due to excess calories: Secondary | ICD-10-CM | POA: Diagnosis not present

## 2021-08-05 DIAGNOSIS — N1831 Chronic kidney disease, stage 3a: Secondary | ICD-10-CM | POA: Diagnosis not present

## 2021-08-05 DIAGNOSIS — R7303 Prediabetes: Secondary | ICD-10-CM | POA: Diagnosis not present

## 2021-08-05 DIAGNOSIS — E6609 Other obesity due to excess calories: Secondary | ICD-10-CM | POA: Diagnosis not present

## 2021-08-05 DIAGNOSIS — R030 Elevated blood-pressure reading, without diagnosis of hypertension: Secondary | ICD-10-CM | POA: Diagnosis not present

## 2021-08-07 DIAGNOSIS — R7303 Prediabetes: Secondary | ICD-10-CM | POA: Diagnosis not present

## 2021-08-07 DIAGNOSIS — Z23 Encounter for immunization: Secondary | ICD-10-CM | POA: Diagnosis not present

## 2021-08-07 DIAGNOSIS — E6609 Other obesity due to excess calories: Secondary | ICD-10-CM | POA: Diagnosis not present

## 2021-08-07 DIAGNOSIS — D638 Anemia in other chronic diseases classified elsewhere: Secondary | ICD-10-CM | POA: Diagnosis not present

## 2021-08-07 DIAGNOSIS — N1831 Chronic kidney disease, stage 3a: Secondary | ICD-10-CM | POA: Diagnosis not present

## 2021-09-15 DIAGNOSIS — Z6830 Body mass index (BMI) 30.0-30.9, adult: Secondary | ICD-10-CM | POA: Diagnosis not present

## 2021-09-15 DIAGNOSIS — E6609 Other obesity due to excess calories: Secondary | ICD-10-CM | POA: Diagnosis not present

## 2021-09-15 DIAGNOSIS — R600 Localized edema: Secondary | ICD-10-CM | POA: Diagnosis not present

## 2021-11-05 NOTE — Progress Notes (Signed)
Cardiology Office Note:   Date:  11/06/2021  NAME:  Brett Gray    MRN: [id number] DOB:  06-29-1946   PCP:  Sharilyn Sites, MD  Cardiologist:  None  Electrophysiologist:  None   Referring MD: Sharilyn Sites, MD   Chief Complaint  Patient presents with   dot physical   History of Present Illness:   Brett Gray is a 76 y.o. male with a hx of traumatic aortic transection status post aortic stent graft in 2011, hypertension, CKD stage III, hyperlipidemia who is being seen today for the evaluation of DOT physical at the request of Sharilyn Sites, MD. he suffered a fall from a ladder in 2011.  Suffered an aortic transection that was traumatic.  He is status post endovascular graft repair.  Most recent CT scan in 2018 shows stable graft repair.  No endovascular leak.  He denies any major symptoms of chest pain.  Can get a little short of breath with activity but this is expected.  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  He needs a stress test as part of a DOT physical.  Blood pressure is 140/70.  No history of heart attack or stroke.  He does not smoke drink alcohol or use drugs.  No major limitations reported in office today.  Problem List Aortic transection  -s/p thoracic aortic stent graft 2011 2. CKD3 3. Hypothyroidism   Past Medical History: Past Medical History:  Diagnosis Date   Arthritis    Hernia, inguinal, bilateral    Hyperlipidemia    Hypothyroidism     Past Surgical History: Past Surgical History:  Procedure Laterality Date   Aortic catheter     percutaneous endovascular repair of thoracic aortic transection   BACK SURGERY     CHOLECYSTECTOMY  09/20/2012   Procedure: LAPAROSCOPIC CHOLECYSTECTOMY;  Surgeon: Jamesetta So, MD;  Location: [facility];  Service: General;  Laterality: N/A;   COLONOSCOPY N/A 06/26/2014   Procedure: COLONOSCOPY;  Surgeon: Danie Binder, MD;  Location: [facility];  Service: Endoscopy;  Laterality: N/A;   1115-rescheduled to [place] notified pt   FRACTURE SURGERY     INGUINAL HERNIA REPAIR  10/18/2000   bilateral   SPINE SURGERY     WRIST FRACTURE SURGERY     YAG LASER APPLICATION Left 78/67/6720   Procedure: YAG LASER APPLICATION;  Surgeon: Rutherford Guys, MD;  Location: [facility];  Service: Ophthalmology;  Laterality: Left;    Current Medications: Current Meds  Medication Sig   aspirin 81 MG tablet Take 81 mg by mouth daily.     levothyroxine (SYNTHROID, LEVOTHROID) 100 MCG tablet Take 125 mcg by mouth daily.    Multiple Vitamin (MULTIVITAMIN WITH MINERALS) TABS Take 1 tablet by mouth daily.   NON FORMULARY Ducosate sodium  100 mg     Takes 2 tablets daily   NON FORMULARY Phillips tablets    One tablet daily   simvastatin (ZOCOR) 40 MG tablet Take 1 tablet by mouth daily.    sodium bicarbonate 650 MG tablet Take 650 mg by mouth 3 (three) times daily.     Allergies:    Patient has no known allergies.   Social History: Social History   Socioeconomic History   Marital status: Married    Spouse name: Not on file   Number of children: 4   Years of education: Not on file   Highest education level: Not on file  Occupational History  Occupation: Administrator  Tobacco Use   Smoking status: Never   Smokeless tobacco: Never   Tobacco comments:    Never smoked  Vaping Use   Vaping Use: Never used  Substance and Sexual Activity   Alcohol use: No    Alcohol/week: 0.0 standard drinks   Drug use: No   Sexual activity: Yes    Birth control/protection: Post-menopausal  Other Topics Concern   Not on file  Social History Narrative   Not on file   Social Determinants of Health   Financial Resource Strain: Not on file  Food Insecurity: Not on file  Transportation Needs: Not on file  Physical Activity: Not on file  Stress: Not on file  Social Connections: Not on file     Family History: The patient's family history includes Breast cancer in his sister and sister; Cancer  in his brother, mother, and sister; Deep vein thrombosis in his father; Diabetes in his mother; Heart disease in his brother and mother; Other in his mother; Skin cancer in his brother. There is no history of Colon cancer.  ROS:   All other ROS reviewed and negative. Pertinent positives noted in the HPI.     EKGs/Labs/Other Studies Reviewed:   The following studies were personally reviewed by me today:  EKG:  EKG is ordered today.  The ekg ordered today demonstrates normal sinus rhythm heart rate 68, nonspecific ST-T changes, and was personally reviewed by me.   Recent Labs: No results found for requested labs within last 8760 hours.   Recent Lipid Panel No results found for: CHOL, TRIG, HDL, CHOLHDL, VLDL, LDLCALC, LDLDIRECT  Physical Exam:   VS:  BP 140/70    Pulse 68    Ht 6' (1.829 m)    Wt 230 lb 6.4 oz (104.5 kg)    SpO2 97%    BMI 31.25 kg/m    Wt Readings from Last 3 Encounters:  11/06/21 230 lb 6.4 oz (104.5 kg)  07/28/17 233 lb (105.7 kg)  02/08/17 228 lb (103.4 kg)    General: Well nourished, well developed, in no acute distress Head: Atraumatic, normal size  Eyes: PEERLA, EOMI  Neck: Supple, no JVD Endocrine: No thryomegaly Cardiac: Normal S1, S2; RRR; no murmurs, rubs, or gallops Lungs: Clear to auscultation bilaterally, no wheezing, rhonchi or rales  Abd: Soft, nontender, no hepatomegaly  Ext: No edema, pulses 2+ Musculoskeletal: No deformities, BUE and BLE strength normal and equal Skin: Warm and dry, no rashes   Neuro: Alert and oriented to person, place, time, and situation, CNII-XII grossly intact, no focal deficits  Psych: Normal mood and affect   ASSESSMENT:   Brett Gray is a 76 y.o. male who presents for the following: 1. Encounter for Department of Transportation (DOT) examination for driving license renewal   2. Shortness of breath   3. Mixed hyperlipidemia   4. Aortic transection, subsequent encounter     PLAN:   1. Encounter for Department  of Transportation (DOT) examination for driving license renewal 2. Shortness of breath -Status post aortic endovascular graft repair of traumatic aortic transection.  This occurred in 2011.  CT scan in 2018 was stable.  He really has done well and needs no further surveillance. -Given history of " stent" he is required to have a stress test. -EKG in office is normal.  He does report minimal shortness of breath but I suspect this is just normal.  We will set him up for an exercise treadmill stress test.  3. Mixed hyperlipidemia -Continue statin.  4. Aortic transection, subsequent encounter -Endovascular repair in 2011.  2018 CT scan shows this is stable.  He will see [place] as needed moving forward.  Shared Decision Making/Informed Consent The risks [chest pain, shortness of breath, cardiac arrhythmias, dizziness, blood pressure fluctuations, myocardial infarction, stroke/transient ischemic attack, and life-threatening complications (estimated to be 1 in 10,000)], benefits (risk stratification, diagnosing coronary artery disease, treatment guidance) and alternatives of an exercise tolerance test were discussed in detail with Brett Gray and he agrees to proceed.  Disposition: Return if symptoms worsen or fail to improve.  Medication Adjustments/Labs and Tests Ordered: Current medicines are reviewed at length with the patient today.  Concerns regarding medicines are outlined above.  Orders Placed This Encounter  Procedures   Cardiac Stress Test: Informed Consent Details: Physician/Practitioner Attestation; Transcribe to consent form and obtain patient signature   EXERCISE TOLERANCE TEST (ETT)   EKG 12-Lead   No orders of the defined types were placed in this encounter.   Patient Instructions  Medication Instructions:  Your physician recommends that you continue on your current medications as directed. Please refer to the Current Medication list given to you  today.  Labwork: none  Testing/Procedures: Your physician has requested that you have an exercise tolerance test. For further information please visit [email or web address]. Please also follow instruction sheet, as given.  Follow-Up: Your physician recommends that you schedule a follow-up appointment in: as needed  Any Other Special Instructions Will Be Listed Below (If Applicable).  If you need a refill on your cardiac medications before your next appointment, please call your pharmacy.    Signed, Addison Naegeli. Audie Box, MD, [facility]  [address] [place], [place] [address] [phone]  11/06/2021 2:23 PM

## 2021-11-06 ENCOUNTER — Encounter: Payer: Self-pay | Admitting: *Deleted

## 2021-11-06 ENCOUNTER — Encounter: Payer: Self-pay | Admitting: Cardiovascular Disease

## 2021-11-06 ENCOUNTER — Ambulatory Visit: Payer: Medicare HMO | Admitting: Cardiovascular Disease

## 2021-11-06 ENCOUNTER — Telehealth: Payer: Self-pay | Admitting: Cardiovascular Disease

## 2021-11-06 ENCOUNTER — Other Ambulatory Visit: Payer: Self-pay

## 2021-11-06 VITALS — BP 140/70 | HR 68 | Ht 72.0 in | Wt 230.4 lb

## 2021-11-06 DIAGNOSIS — E782 Mixed hyperlipidemia: Secondary | ICD-10-CM | POA: Diagnosis not present

## 2021-11-06 DIAGNOSIS — S2509XD Other specified injury of thoracic aorta, subsequent encounter: Secondary | ICD-10-CM

## 2021-11-06 DIAGNOSIS — E785 Hyperlipidemia, unspecified: Secondary | ICD-10-CM | POA: Insufficient documentation

## 2021-11-06 DIAGNOSIS — R0602 Shortness of breath: Secondary | ICD-10-CM

## 2021-11-06 DIAGNOSIS — Z024 Encounter for examination for driving license: Secondary | ICD-10-CM | POA: Diagnosis not present

## 2021-11-06 NOTE — Patient Instructions (Addendum)
Medication Instructions:  Your physician recommends that you continue on your current medications as directed. Please refer to the Current Medication list given to you today.  Labwork: none  Testing/Procedures: Your physician has requested that you have an exercise tolerance test. For further information please visit [email or web address]. Please also follow instruction sheet, as given.  Follow-Up: Your physician recommends that you schedule a follow-up appointment in: as needed  Any Other Special Instructions Will Be Listed Below (If Applicable).  If you need a refill on your cardiac medications before your next appointment, please call your pharmacy.

## 2021-11-06 NOTE — Telephone Encounter (Signed)
Checking percert on the following patient for testing scheduled at [facility].      GXT    11/10/2021

## 2021-11-10 ENCOUNTER — Ambulatory Visit (HOSPITAL_COMMUNITY)
Admission: RE | Admit: 2021-11-10 | Discharge: 2021-11-10 | Disposition: A | Discharge status: Home / Self Care | Payer: Medicare HMO | Source: Ambulatory Visit | Attending: Cardiovascular Disease | Admitting: Cardiovascular Disease

## 2021-11-10 ENCOUNTER — Other Ambulatory Visit: Payer: Self-pay

## 2021-11-10 DIAGNOSIS — R0602 Shortness of breath: Secondary | ICD-10-CM

## 2021-11-10 DIAGNOSIS — Z024 Encounter for examination for driving license: Secondary | ICD-10-CM

## 2021-11-10 LAB — EXERCISE TOLERANCE TEST
Angina Index: 0
Duke Treadmill Score: 7
Estimated workload: 8.2
Exercise duration (min): 6 min
Exercise duration (sec): 42 s
MPHR: 144 {beats}/min
Peak HR: 127 {beats}/min
Percent HR: 88 %
RPE: 11
Rest HR: 74 {beats}/min
ST Depression (mm): 0 mm

## 2021-11-11 ENCOUNTER — Other Ambulatory Visit: Payer: Self-pay

## 2021-11-12 ENCOUNTER — Other Ambulatory Visit: Payer: Self-pay

## 2021-11-12 DIAGNOSIS — R0602 Shortness of breath: Secondary | ICD-10-CM

## 2021-11-12 NOTE — Progress Notes (Signed)
Order for repeat exercise tolerance test  placed per Dr. Audie Box

## 2021-11-23 ENCOUNTER — Encounter (HOSPITAL_COMMUNITY): Payer: Self-pay

## 2021-11-23 ENCOUNTER — Other Ambulatory Visit: Payer: Self-pay

## 2021-11-23 ENCOUNTER — Ambulatory Visit (HOSPITAL_COMMUNITY)
Admission: RE | Admit: 2021-11-23 | Discharge: 2021-11-23 | Disposition: A | Discharge status: Home / Self Care | Payer: Medicare HMO | Source: Ambulatory Visit | Attending: Cardiovascular Disease | Admitting: Cardiovascular Disease

## 2021-11-23 ENCOUNTER — Telehealth: Payer: Self-pay | Admitting: Cardiovascular Disease

## 2021-11-23 DIAGNOSIS — R0602 Shortness of breath: Secondary | ICD-10-CM

## 2021-11-23 NOTE — Telephone Encounter (Signed)
Geralynn Rile, MD  11/11/2021 10:05 AM EST     We need to him to repeat the stress test with nuclear imaging. The doctor was concerned about extra heart beats during the test and we want to take a closer look at the heart. Set him up at [facility].    Looks like testing was put in for repeat ETT- order is in the system now for the stress with nuclear imaging.   Thank you!

## 2021-11-23 NOTE — Telephone Encounter (Signed)
[facility] lab.. has a question about an order put in for Dr. Marisue Ivan

## 2021-11-23 NOTE — Telephone Encounter (Signed)
Spoke with Brett Gray, she reports the patient had a GXT 2 weeks ago and he is there now for another. She is questioning if needed. Aware there are no notes in the chart to confirm or deny. She is going to send the patient home and she ask that someone here contact the patient. Will forward to dr o'neal's nurse and the nurse that put the order in.

## 2021-12-08 ENCOUNTER — Other Ambulatory Visit: Payer: Self-pay

## 2021-12-08 ENCOUNTER — Encounter (HOSPITAL_COMMUNITY): Payer: Self-pay

## 2021-12-08 ENCOUNTER — Encounter (HOSPITAL_COMMUNITY)
Admission: RE | Admit: 2021-12-08 | Discharge: 2021-12-08 | Disposition: A | Discharge status: Home / Self Care | Payer: Medicare HMO | Source: Ambulatory Visit | Attending: Cardiovascular Disease | Admitting: Cardiovascular Disease

## 2021-12-08 ENCOUNTER — Ambulatory Visit (HOSPITAL_COMMUNITY)
Admission: RE | Admit: 2021-12-08 | Discharge: 2021-12-08 | Disposition: A | Discharge status: Home / Self Care | Payer: Medicare HMO | Source: Ambulatory Visit | Attending: Cardiovascular Disease | Admitting: Cardiovascular Disease

## 2021-12-08 DIAGNOSIS — R0602 Shortness of breath: Secondary | ICD-10-CM | POA: Diagnosis not present

## 2021-12-08 LAB — NM MYOCAR MULTI W/SPECT W/WALL MOTION / EF
Angina Index: 0
Duke Treadmill Score: 8
Estimated workload: 9.4
Exercise duration (min): 7 min
Exercise duration (sec): 35 s
LV dias vol: 118 mL (ref 62–150)
LV sys vol: 46 mL
MPHR: 144 {beats}/min
Nuc Stress EF: 61 %
Peak HR: 126 {beats}/min
Percent HR: 87 %
RATE: 0.4
RPE: 13
Rest HR: 55 {beats}/min
Rest Nuclear Isotope Dose: 11 mCi
SDS: 2
SRS: 2
SSS: 4
ST Depression (mm): 0 mm
Stress Nuclear Isotope Dose: 33 mCi
TID: 1.06

## 2021-12-08 MED ORDER — SODIUM CHLORIDE FLUSH 0.9 % IV SOLN
INTRAVENOUS | Status: AC
  Administered 2021-12-08: 10 mL via INTRAVENOUS
  Filled 2021-12-08: qty 10

## 2021-12-08 MED ORDER — TECHNETIUM TC 99M TETROFOSMIN IV KIT
30.0000 | PACK | Freq: Once | INTRAVENOUS | Status: AC | PRN
  Administered 2021-12-08: 33 via INTRAVENOUS

## 2021-12-08 MED ORDER — REGADENOSON 0.4 MG/5ML IV SOLN
INTRAVENOUS | Status: AC
  Filled 2021-12-08: qty 5

## 2021-12-08 MED ORDER — TECHNETIUM TC 99M TETROFOSMIN IV KIT
10.0000 | PACK | Freq: Once | INTRAVENOUS | Status: AC | PRN
  Administered 2021-12-08: 11 via INTRAVENOUS

## 2021-12-10 ENCOUNTER — Telehealth: Payer: Self-pay | Admitting: Cardiovascular Disease

## 2021-12-10 NOTE — Telephone Encounter (Signed)
results given to patient

## 2021-12-10 NOTE — Telephone Encounter (Signed)
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

## 2021-12-16 ENCOUNTER — Ambulatory Visit: Payer: Medicare HMO | Admitting: Cardiology

## 2021-12-17 DIAGNOSIS — Z0100 Encounter for examination of eyes and vision without abnormal findings: Secondary | ICD-10-CM | POA: Diagnosis not present

## 2021-12-17 DIAGNOSIS — H524 Presbyopia: Secondary | ICD-10-CM | POA: Diagnosis not present

## 2021-12-17 DIAGNOSIS — Z961 Presence of intraocular lens: Secondary | ICD-10-CM | POA: Diagnosis not present

## 2021-12-17 DIAGNOSIS — H52203 Unspecified astigmatism, bilateral: Secondary | ICD-10-CM | POA: Diagnosis not present

## 2022-03-01 DIAGNOSIS — N1831 Chronic kidney disease, stage 3a: Secondary | ICD-10-CM | POA: Diagnosis not present

## 2022-03-01 DIAGNOSIS — R7303 Prediabetes: Secondary | ICD-10-CM | POA: Diagnosis not present

## 2022-03-01 DIAGNOSIS — D638 Anemia in other chronic diseases classified elsewhere: Secondary | ICD-10-CM | POA: Diagnosis not present

## 2022-03-01 DIAGNOSIS — E6609 Other obesity due to excess calories: Secondary | ICD-10-CM | POA: Diagnosis not present

## 2022-03-10 DIAGNOSIS — R7303 Prediabetes: Secondary | ICD-10-CM | POA: Diagnosis not present

## 2022-03-10 DIAGNOSIS — E6609 Other obesity due to excess calories: Secondary | ICD-10-CM | POA: Diagnosis not present

## 2022-03-10 DIAGNOSIS — N1831 Chronic kidney disease, stage 3a: Secondary | ICD-10-CM | POA: Diagnosis not present

## 2022-04-21 DIAGNOSIS — L03119 Cellulitis of unspecified part of limb: Secondary | ICD-10-CM | POA: Diagnosis not present

## 2022-04-21 DIAGNOSIS — E663 Overweight: Secondary | ICD-10-CM | POA: Diagnosis not present

## 2022-04-21 DIAGNOSIS — Z1331 Encounter for screening for depression: Secondary | ICD-10-CM | POA: Diagnosis not present

## 2022-04-21 DIAGNOSIS — D518 Other vitamin B12 deficiency anemias: Secondary | ICD-10-CM | POA: Diagnosis not present

## 2022-04-21 DIAGNOSIS — E559 Vitamin D deficiency, unspecified: Secondary | ICD-10-CM | POA: Diagnosis not present

## 2022-04-21 DIAGNOSIS — Z0001 Encounter for general adult medical examination with abnormal findings: Secondary | ICD-10-CM | POA: Diagnosis not present

## 2022-04-21 DIAGNOSIS — E039 Hypothyroidism, unspecified: Secondary | ICD-10-CM | POA: Diagnosis not present

## 2022-04-21 DIAGNOSIS — I872 Venous insufficiency (chronic) (peripheral): Secondary | ICD-10-CM | POA: Diagnosis not present

## 2022-04-21 DIAGNOSIS — Z125 Encounter for screening for malignant neoplasm of prostate: Secondary | ICD-10-CM | POA: Diagnosis not present

## 2022-04-21 DIAGNOSIS — Z6829 Body mass index (BMI) 29.0-29.9, adult: Secondary | ICD-10-CM | POA: Diagnosis not present

## 2022-07-26 ENCOUNTER — Other Ambulatory Visit: Payer: Self-pay | Admitting: Family Medicine

## 2022-07-26 ENCOUNTER — Other Ambulatory Visit (HOSPITAL_COMMUNITY): Payer: Self-pay | Admitting: Family Medicine

## 2022-07-26 ENCOUNTER — Ambulatory Visit (HOSPITAL_COMMUNITY)
Admission: RE | Admit: 2022-07-26 | Discharge: 2022-07-26 | Disposition: A | Discharge status: Home / Self Care | Payer: Medicare HMO | Source: Ambulatory Visit | Attending: Family Medicine | Admitting: Family Medicine

## 2022-07-26 DIAGNOSIS — M7989 Other specified soft tissue disorders: Secondary | ICD-10-CM | POA: Diagnosis not present

## 2022-07-26 DIAGNOSIS — I872 Venous insufficiency (chronic) (peripheral): Secondary | ICD-10-CM | POA: Diagnosis not present

## 2022-07-26 DIAGNOSIS — L03119 Cellulitis of unspecified part of limb: Secondary | ICD-10-CM | POA: Diagnosis not present

## 2022-07-26 DIAGNOSIS — Z6829 Body mass index (BMI) 29.0-29.9, adult: Secondary | ICD-10-CM | POA: Diagnosis not present

## 2022-07-26 DIAGNOSIS — E6609 Other obesity due to excess calories: Secondary | ICD-10-CM | POA: Diagnosis not present

## 2022-07-26 DIAGNOSIS — Z23 Encounter for immunization: Secondary | ICD-10-CM | POA: Diagnosis not present

## 2022-08-23 DIAGNOSIS — E6609 Other obesity due to excess calories: Secondary | ICD-10-CM | POA: Diagnosis not present

## 2022-08-23 DIAGNOSIS — R7303 Prediabetes: Secondary | ICD-10-CM | POA: Diagnosis not present

## 2022-08-23 DIAGNOSIS — N1831 Chronic kidney disease, stage 3a: Secondary | ICD-10-CM | POA: Diagnosis not present

## 2022-10-01 DIAGNOSIS — R7303 Prediabetes: Secondary | ICD-10-CM | POA: Diagnosis not present

## 2022-10-01 DIAGNOSIS — N1831 Chronic kidney disease, stage 3a: Secondary | ICD-10-CM | POA: Diagnosis not present

## 2022-10-01 DIAGNOSIS — E6609 Other obesity due to excess calories: Secondary | ICD-10-CM | POA: Diagnosis not present

## 2022-12-20 DIAGNOSIS — H52203 Unspecified astigmatism, bilateral: Secondary | ICD-10-CM | POA: Diagnosis not present

## 2022-12-20 DIAGNOSIS — H524 Presbyopia: Secondary | ICD-10-CM | POA: Diagnosis not present

## 2023-04-05 DIAGNOSIS — R809 Proteinuria, unspecified: Secondary | ICD-10-CM | POA: Diagnosis not present

## 2023-04-05 DIAGNOSIS — N1831 Chronic kidney disease, stage 3a: Secondary | ICD-10-CM | POA: Diagnosis not present

## 2023-04-05 DIAGNOSIS — I129 Hypertensive chronic kidney disease with stage 1 through stage 4 chronic kidney disease, or unspecified chronic kidney disease: Secondary | ICD-10-CM | POA: Diagnosis not present

## 2023-04-05 DIAGNOSIS — D631 Anemia in chronic kidney disease: Secondary | ICD-10-CM | POA: Diagnosis not present

## 2023-04-13 DIAGNOSIS — N182 Chronic kidney disease, stage 2 (mild): Secondary | ICD-10-CM | POA: Diagnosis not present

## 2023-04-13 DIAGNOSIS — E663 Overweight: Secondary | ICD-10-CM | POA: Diagnosis not present

## 2023-04-13 DIAGNOSIS — E039 Hypothyroidism, unspecified: Secondary | ICD-10-CM | POA: Diagnosis not present

## 2023-04-13 DIAGNOSIS — D72819 Decreased white blood cell count, unspecified: Secondary | ICD-10-CM | POA: Diagnosis not present

## 2023-04-25 DIAGNOSIS — M1991 Primary osteoarthritis, unspecified site: Secondary | ICD-10-CM | POA: Diagnosis not present

## 2023-04-25 DIAGNOSIS — Z1331 Encounter for screening for depression: Secondary | ICD-10-CM | POA: Diagnosis not present

## 2023-04-25 DIAGNOSIS — Z6829 Body mass index (BMI) 29.0-29.9, adult: Secondary | ICD-10-CM | POA: Diagnosis not present

## 2023-04-25 DIAGNOSIS — E782 Mixed hyperlipidemia: Secondary | ICD-10-CM | POA: Diagnosis not present

## 2023-04-25 DIAGNOSIS — N1831 Chronic kidney disease, stage 3a: Secondary | ICD-10-CM | POA: Diagnosis not present

## 2023-04-25 DIAGNOSIS — E039 Hypothyroidism, unspecified: Secondary | ICD-10-CM | POA: Diagnosis not present

## 2023-04-25 DIAGNOSIS — E663 Overweight: Secondary | ICD-10-CM | POA: Diagnosis not present

## 2023-04-25 DIAGNOSIS — I739 Peripheral vascular disease, unspecified: Secondary | ICD-10-CM | POA: Diagnosis not present

## 2023-04-25 DIAGNOSIS — Z0001 Encounter for general adult medical examination with abnormal findings: Secondary | ICD-10-CM | POA: Diagnosis not present

## 2023-06-23 DIAGNOSIS — L821 Other seborrheic keratosis: Secondary | ICD-10-CM | POA: Diagnosis not present

## 2023-06-23 DIAGNOSIS — L578 Other skin changes due to chronic exposure to nonionizing radiation: Secondary | ICD-10-CM | POA: Diagnosis not present

## 2023-06-23 DIAGNOSIS — L570 Actinic keratosis: Secondary | ICD-10-CM | POA: Diagnosis not present

## 2023-06-23 DIAGNOSIS — X32XXXA Exposure to sunlight, initial encounter: Secondary | ICD-10-CM | POA: Diagnosis not present

## 2023-09-19 DIAGNOSIS — E039 Hypothyroidism, unspecified: Secondary | ICD-10-CM | POA: Diagnosis not present

## 2023-09-19 DIAGNOSIS — E559 Vitamin D deficiency, unspecified: Secondary | ICD-10-CM | POA: Diagnosis not present

## 2023-09-19 DIAGNOSIS — D72819 Decreased white blood cell count, unspecified: Secondary | ICD-10-CM | POA: Diagnosis not present

## 2023-09-19 DIAGNOSIS — N182 Chronic kidney disease, stage 2 (mild): Secondary | ICD-10-CM | POA: Diagnosis not present

## 2023-09-19 DIAGNOSIS — E663 Overweight: Secondary | ICD-10-CM | POA: Diagnosis not present

## 2023-09-29 DIAGNOSIS — N182 Chronic kidney disease, stage 2 (mild): Secondary | ICD-10-CM | POA: Diagnosis not present

## 2023-09-29 DIAGNOSIS — E039 Hypothyroidism, unspecified: Secondary | ICD-10-CM | POA: Diagnosis not present

## 2023-09-29 DIAGNOSIS — N1831 Chronic kidney disease, stage 3a: Secondary | ICD-10-CM | POA: Diagnosis not present

## 2023-11-03 NOTE — Progress Notes (Signed)
Cardiology Office Note:  .   Date:  11/04/2023  ID:  Brett Gray, DOB Dec 20, 1945, MRN [id number] PCP: Assunta Found, MD  [facility] Providers Cardiologist:  Reatha Harps, MD { History of Present Illness: .   Brett Gray is a 78 y.o. male with history of aortic transection s/p stenting who presents for follow-up.   History of Present Illness   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 He reports no symptoms of chest pain or trouble breathing. 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 He had a visit to the kidney doctor a couple of months ago, where he underwent extensive blood work, and everything came back good. He reports a wheezing sound, which he has had for years, but it has been attributed to an issue similar to asthma, not a lung problem. He also mentions a recent illness after Christmas that lasted for about a week.          Problem List Aortic transection (traumatic) -s/p thoracic aortic stent graft 2011 2. CKD3 3. Hypothyroidism  4. HLD -T chol 108, HDL 31, LDL 52, TG 143    ROS: All other ROS reviewed and negative. Pertinent positives noted in the HPI.     Studies Reviewed: Marland Kitchen   EKG Interpretation Date/Time:  Friday November 04 2023 16:04:34 EST Ventricular Rate:  65 PR Interval:  138 QRS Duration:  102 QT Interval:  390 QTC Calculation: 405 R Axis:   44  Text Interpretation: Normal sinus rhythm Nonspecific T wave abnormality Confirmed by Lennie Odor [phone]) on 11/04/2023 4:10:03 PM   Physical Exam:   VS:  BP 128/70 (BP Location: Left Arm, Patient Position: Sitting, Cuff Size: Normal)   Pulse 65   Ht 6' (1.829 m)   Wt 225 lb (102.1 kg)   BMI 30.52 kg/m    Wt Readings from Last 3 Encounters:  11/04/23 225 lb (102.1 kg)  11/06/21 230 lb 6.4 oz (104.5 kg)   07/28/17 233 lb (105.7 kg)    GEN: Well nourished, well developed in no acute distress NECK: No JVD; No carotid bruits CARDIAC: RRR, no murmurs, rubs, gallops RESPIRATORY:  Clear to auscultation without rales, wheezing or rhonchi  ABDOMEN: Soft, non-tender, non-distended EXTREMITIES:  No edema; No deformity  ASSESSMENT AND PLAN: .   Assessment and Plan    Aortic Transection Status Post Thoracic Aortic Stenting No symptoms of chest pain or trouble breathing. Last imaging of stent was in 2018. -Order CTA of chest to assess stent. -BMP today -Continue aspirin.  Chronic Kidney Disease (CKD) Stage 3A Most recent eGFR is 59. -Check BMP today to ensure kidney function is stable before CTA.  Department of Transportation (DOT) Physical Requires stress test every other year. -Schedule pharmacological stress test due to patient's uncertainty about ability to walk fast enough on treadmill.  Hyperlipidemia Well controlled on statin, with recent LDL cholesterol within target range. -Continue current statin therapy.  Follow-up in 2 years or sooner if any symptoms of chest pain or trouble breathing occur.          Informed Consent   Shared Decision Making/Informed Consent The risks [chest pain, shortness of breath, cardiac arrhythmias, dizziness, blood pressure fluctuations, myocardial infarction, stroke/transient ischemic attack, nausea, vomiting, allergic reaction, radiation exposure, metallic taste sensation and life-threatening complications (estimated to be 1  in 10,000)], benefits (risk stratification, diagnosing coronary artery disease, treatment guidance) and alternatives of a nuclear stress test were discussed in detail with Mr. Cantave and he agrees to proceed.      Follow-up: Return in about 2 years (around 11/03/2025).  Signed, Lenna Gilford. Flora Lipps, MD, [facility]  [facility]  [address], [address] [place], [place] [address] [phone]  4:26 PM

## 2023-11-04 ENCOUNTER — Ambulatory Visit: Payer: Medicare HMO | Attending: Cardiovascular Disease | Admitting: Cardiovascular Disease

## 2023-11-04 ENCOUNTER — Encounter: Payer: Self-pay | Admitting: Cardiovascular Disease

## 2023-11-04 VITALS — BP 128/70 | HR 65 | Ht 72.0 in | Wt 225.0 lb

## 2023-11-04 DIAGNOSIS — S2500XD Unspecified injury of thoracic aorta, subsequent encounter: Secondary | ICD-10-CM

## 2023-11-04 DIAGNOSIS — E782 Mixed hyperlipidemia: Secondary | ICD-10-CM | POA: Diagnosis not present

## 2023-11-04 DIAGNOSIS — Z024 Encounter for examination for driving license: Secondary | ICD-10-CM | POA: Diagnosis not present

## 2023-11-04 DIAGNOSIS — S2509XD Other specified injury of thoracic aorta, subsequent encounter: Secondary | ICD-10-CM

## 2023-11-04 NOTE — Patient Instructions (Addendum)
Medication Instructions:  Your physician recommends that you continue on your current medications as directed. Please refer to the Current Medication list given to you today.    *If you need a refill on your cardiac medications before your next appointment, please call your pharmacy*   Lab Work: BMP   If you have labs (blood work) drawn today and your tests are completely normal, you will receive your results only by: MyChart Message (if you have MyChart) OR A paper copy in the mail If you have any lab test that is abnormal or we need to change your treatment, we will call you to review the results.   Testing/Procedures: Non-Cardiac CT scanning, (CAT scanning), is a noninvasive, special x-ray that produces cross-sectional images of the body using x-rays and a computer. CT scans help physicians diagnose and treat medical conditions. For some CT exams, a contrast material is used to enhance visibility in the area of the body being studied. CT scans provide greater clarity and reveal more details than regular x-ray exams.   Dr. Flora Lipps, MD has ordered a Lexiscan Myocardial Perfusion Imaging Study.  Please arrive 15 minutes prior to your appointment time for registration and insurance purposes.   The test will take approximately 3 to 4 hours to complete; you may bring reading material.  If someone comes with you to your appointment, they will need to remain in the main lobby due to limited space in the testing area. **If you are pregnant or breastfeeding, please notify the nuclear lab prior to your appointment**   How to prepare for your Myocardial Perfusion Test: Do not eat or drink 3 hours prior to your test, except you may have water. Do not consume products containing caffeine (regular or decaffeinated) 12 hours prior to your test. (ex: coffee, chocolate, sodas, tea). Do wear comfortable clothes (no dresses or overalls) and walking shoes, tennis shoes preferred (No heels or open toe  shoes are allowed). Do NOT wear cologne, perfume, aftershave, or lotions (deodorant is allowed). If you use an inhaler, use it the AM of your test and bring it with you.  If you use a nebulizer, use it the AM of your test.  If these instructions are not followed, your test will have to be rescheduled.    Follow-Up: At [facility], you and your health needs are our priority.  As part of our continuing mission to provide you with exceptional heart care, we have created designated Provider Care Teams.  These Care Teams include your primary Cardiologist (physician) and Advanced Practice Providers (APPs -  Physician Assistants and Nurse Practitioners) who all work together to provide you with the care you need, when you need it.  We recommend signing up for the patient portal called "MyChart".  Sign up information is provided on this After Visit Summary.  MyChart is used to connect with patients for Virtual Visits (Telemedicine).  Patients are able to view lab/test results, encounter notes, upcoming appointments, etc.  Non-urgent messages can be sent to your provider as well.   To learn more about what you can do with MyChart, go to [email or web address].    Your next appointment:   2 year(s)  The format for your next appointment:   In Person  Provider:   Edd Fabian, FNP, Micah Flesher, PA-C, Marjie Skiff, PA-C, Robet Leu, PA-C, Juanda Crumble, PA-C, Joni Reining, DNP, ANP, Azalee Course, PA-C, Bernadene Person, NP, or Reather Littler, NP      Other Instructions

## 2023-11-05 LAB — BASIC METABOLIC PANEL
BUN/Creatinine Ratio: 18 (ref 10–24)
BUN: 24 mg/dL (ref 8–27)
CO2: 24 mmol/L (ref 20–29)
Calcium: 9.1 mg/dL (ref 8.6–10.2)
Chloride: 104 mmol/L (ref 96–106)
Creatinine, Ser: 1.32 mg/dL — ABNORMAL HIGH (ref 0.76–1.27)
Glucose: 113 mg/dL — ABNORMAL HIGH (ref 70–99)
Potassium: 4.4 mmol/L (ref 3.5–5.2)
Sodium: 141 mmol/L (ref 134–144)
eGFR: 55 mL/min/{1.73_m2} — ABNORMAL LOW (ref >59)

## 2023-11-07 ENCOUNTER — Telehealth (HOSPITAL_COMMUNITY): Payer: Self-pay | Admitting: *Deleted

## 2023-11-07 ENCOUNTER — Telehealth: Payer: Self-pay | Admitting: Cardiovascular Disease

## 2023-11-07 NOTE — Telephone Encounter (Signed)
Patient returned RN's call regarding upcoming appointment.  See previous note.

## 2023-11-07 NOTE — Telephone Encounter (Signed)
Attempted to call patient regarding an upcoming appointment- no answer and unable to leave a message.  Brett Gray

## 2023-11-10 ENCOUNTER — Ambulatory Visit (HOSPITAL_COMMUNITY): Payer: Medicare HMO | Attending: Cardiovascular Disease

## 2023-11-10 DIAGNOSIS — R9431 Abnormal electrocardiogram [ECG] [EKG]: Secondary | ICD-10-CM | POA: Diagnosis not present

## 2023-11-10 DIAGNOSIS — S2500XD Unspecified injury of thoracic aorta, subsequent encounter: Secondary | ICD-10-CM | POA: Diagnosis not present

## 2023-11-10 DIAGNOSIS — X58XXXD Exposure to other specified factors, subsequent encounter: Secondary | ICD-10-CM | POA: Diagnosis not present

## 2023-11-10 DIAGNOSIS — Z024 Encounter for examination for driving license: Secondary | ICD-10-CM | POA: Insufficient documentation

## 2023-11-10 LAB — MYOCARDIAL PERFUSION IMAGING
LV dias vol: 94 mL (ref 62–150)
LV sys vol: 34 mL
Nuc Stress EF: 64 %
Peak HR: 81 {beats}/min
Rest HR: 56 {beats}/min
Rest Nuclear Isotope Dose: 10.9 mCi
SDS: 1
SRS: 3
SSS: 4
ST Depression (mm): 0 mm
Stress Nuclear Isotope Dose: 32.7 mCi
TID: 0.9

## 2023-11-10 MED ORDER — TECHNETIUM TC 99M TETROFOSMIN IV KIT
10.9000 | PACK | Freq: Once | INTRAVENOUS | Status: AC | PRN
  Administered 2023-11-10: 10.9 via INTRAVENOUS

## 2023-11-10 MED ORDER — REGADENOSON 0.4 MG/5ML IV SOLN
0.4000 mg | Freq: Once | INTRAVENOUS | Status: AC
  Administered 2023-11-10: 0.4 mg via INTRAVENOUS

## 2023-11-10 MED ORDER — TECHNETIUM TC 99M TETROFOSMIN IV KIT
32.7000 | PACK | Freq: Once | INTRAVENOUS | Status: AC | PRN
  Administered 2023-11-10: 32.7 via INTRAVENOUS

## 2023-11-18 ENCOUNTER — Ambulatory Visit (HOSPITAL_COMMUNITY)
Admission: RE | Admit: 2023-11-18 | Discharge: 2023-11-18 | Disposition: A | Discharge status: Home / Self Care | Payer: Medicare HMO | Source: Ambulatory Visit | Attending: Cardiovascular Disease | Admitting: Cardiovascular Disease

## 2023-11-18 DIAGNOSIS — S2509XD Other specified injury of thoracic aorta, subsequent encounter: Secondary | ICD-10-CM | POA: Insufficient documentation

## 2023-11-18 DIAGNOSIS — I700 Atherosclerosis of aorta: Secondary | ICD-10-CM | POA: Diagnosis not present

## 2023-11-18 DIAGNOSIS — I2510 Atherosclerotic heart disease of native coronary artery without angina pectoris: Secondary | ICD-10-CM | POA: Diagnosis not present

## 2023-11-18 MED ORDER — IOHEXOL 350 MG/ML SOLN
75.0000 mL | Freq: Once | INTRAVENOUS | Status: AC | PRN
  Administered 2023-11-18: 150 mL via INTRAVENOUS

## 2023-11-18 NOTE — ED Provider Notes (Cosign Needed)
  Patient IV infiltrated while contrast was administered in the CT machine. I was called in since no Radiology PA was working. Contrast diffusely spread through left upper arm. No obvious swelling or bruising present. +2 radial pulse. Sensation to light touch intact. Area non-tense. Brisk capillary refill. Patient stating that he feels good. No complaint.       Dorthy Cooler, [place] 11/18/23 [phone]

## 2023-11-21 ENCOUNTER — Encounter: Payer: Self-pay | Admitting: Cardiovascular Disease

## 2023-11-21 DIAGNOSIS — R030 Elevated blood-pressure reading, without diagnosis of hypertension: Secondary | ICD-10-CM | POA: Diagnosis not present

## 2023-11-21 DIAGNOSIS — Z6830 Body mass index (BMI) 30.0-30.9, adult: Secondary | ICD-10-CM | POA: Diagnosis not present

## 2023-11-21 DIAGNOSIS — N1831 Chronic kidney disease, stage 3a: Secondary | ICD-10-CM | POA: Diagnosis not present

## 2023-11-21 DIAGNOSIS — E6609 Other obesity due to excess calories: Secondary | ICD-10-CM | POA: Diagnosis not present

## 2023-12-28 DIAGNOSIS — R262 Difficulty in walking, not elsewhere classified: Secondary | ICD-10-CM | POA: Diagnosis not present

## 2023-12-28 DIAGNOSIS — M25562 Pain in left knee: Secondary | ICD-10-CM | POA: Diagnosis not present

## 2023-12-28 DIAGNOSIS — M25561 Pain in right knee: Secondary | ICD-10-CM | POA: Diagnosis not present

## 2023-12-28 DIAGNOSIS — M170 Bilateral primary osteoarthritis of knee: Secondary | ICD-10-CM | POA: Diagnosis not present

## 2024-01-05 DIAGNOSIS — M170 Bilateral primary osteoarthritis of knee: Secondary | ICD-10-CM | POA: Diagnosis not present

## 2024-01-05 DIAGNOSIS — M25561 Pain in right knee: Secondary | ICD-10-CM | POA: Diagnosis not present

## 2024-01-05 DIAGNOSIS — R262 Difficulty in walking, not elsewhere classified: Secondary | ICD-10-CM | POA: Diagnosis not present

## 2024-01-05 DIAGNOSIS — M25562 Pain in left knee: Secondary | ICD-10-CM | POA: Diagnosis not present

## 2024-01-09 DIAGNOSIS — H524 Presbyopia: Secondary | ICD-10-CM | POA: Diagnosis not present

## 2024-03-06 ENCOUNTER — Other Ambulatory Visit: Payer: Self-pay

## 2024-03-06 ENCOUNTER — Encounter (HOSPITAL_COMMUNITY): Payer: Self-pay

## 2024-03-06 ENCOUNTER — Emergency Department (HOSPITAL_COMMUNITY)
Admission: EM | Admit: 2024-03-06 | Discharge: 2024-03-07 | Disposition: A | Discharge status: Home / Self Care | Attending: Emergency Medicine | Admitting: Emergency Medicine

## 2024-03-06 DIAGNOSIS — R112 Nausea with vomiting, unspecified: Secondary | ICD-10-CM | POA: Insufficient documentation

## 2024-03-06 DIAGNOSIS — E039 Hypothyroidism, unspecified: Secondary | ICD-10-CM | POA: Insufficient documentation

## 2024-03-06 DIAGNOSIS — M79601 Pain in right arm: Secondary | ICD-10-CM | POA: Insufficient documentation

## 2024-03-06 LAB — CBC
HCT: 42.7 % (ref 39.0–52.0)
Hemoglobin: 14.3 g/dL (ref 13.0–17.0)
MCH: 30.8 pg (ref 26.0–34.0)
MCHC: 33.5 g/dL (ref 30.0–36.0)
MCV: 92 fL (ref 80.0–100.0)
Platelets: 164 10*3/uL (ref 150–400)
RBC: 4.64 MIL/uL (ref 4.22–5.81)
RDW: 12.7 % (ref 11.5–15.5)
WBC: 5.8 10*3/uL (ref 4.0–10.5)
nRBC: 0 % (ref 0.0–0.2)

## 2024-03-06 NOTE — ED Triage Notes (Signed)
 Pt POV d/t N/V/D after a company picnic and some right arm numbness and pain also.  Pt states he is not having it right now but concerned about it.  Pt has hx of cardiac stent.

## 2024-03-07 LAB — URINALYSIS, ROUTINE W REFLEX MICROSCOPIC
Bacteria, UA: NONE SEEN
Bilirubin Urine: NEGATIVE
Glucose, UA: NEGATIVE mg/dL
Hgb urine dipstick: NEGATIVE
Ketones, ur: NEGATIVE mg/dL
Leukocytes,Ua: NEGATIVE
Nitrite: NEGATIVE
Protein, ur: 30 mg/dL — AB
Specific Gravity, Urine: 1.025 (ref 1.005–1.030)
pH: 6 (ref 5.0–8.0)

## 2024-03-07 LAB — COMPREHENSIVE METABOLIC PANEL WITH GFR
ALT: 21 U/L (ref 0–44)
AST: 30 U/L (ref 15–41)
Albumin: 3.9 g/dL (ref 3.5–5.0)
Alkaline Phosphatase: 63 U/L (ref 38–126)
Anion gap: 9 (ref 5–15)
BUN: 23 mg/dL (ref 8–23)
CO2: 23 mmol/L (ref 22–32)
Calcium: 8.5 mg/dL — ABNORMAL LOW (ref 8.9–10.3)
Chloride: 105 mmol/L (ref 98–111)
Creatinine, Ser: 1.12 mg/dL (ref 0.61–1.24)
GFR, Estimated: >60 mL/min (ref >60)
Glucose, Bld: 126 mg/dL — ABNORMAL HIGH (ref 70–99)
Potassium: 3.7 mmol/L (ref 3.5–5.1)
Sodium: 140 mmol/L (ref 135–145)
Total Bilirubin: 1.2 mg/dL (ref 0.0–1.2)
Total Protein: 7.3 g/dL (ref 6.5–8.1)

## 2024-03-07 LAB — TROPONIN I (HIGH SENSITIVITY)
Troponin I (High Sensitivity): 4 ng/L (ref <18)
Troponin I (High Sensitivity): 4 ng/L (ref <18)

## 2024-03-07 LAB — LIPASE, BLOOD: Lipase: 35 U/L (ref 11–51)

## 2024-03-07 MED ORDER — ONDANSETRON 4 MG PO TBDP: 4.0000 mg | ORAL_TABLET | Freq: Three times a day (TID) | ORAL | 0 refills | Status: AC | PRN

## 2024-03-07 NOTE — Discharge Instructions (Signed)
 You were evaluated in the Emergency Department and after careful evaluation, we did not find any emergent condition requiring admission or further testing in the hospital.  Your exam/testing today is overall reassuring.  No signs of heart strain or damage.  Can use Zofran  at home as needed for nausea.  Follow-up with your primary care doctor to discuss your symptoms.  Please return to the Emergency Department if you experience any worsening of your condition.   Thank you for allowing us  to be a part of your care.

## 2024-03-07 NOTE — ED Provider Notes (Signed)
 [facility]ENC[facility] DE[facility] Emergency Department Provider Note MRN:  [id number]  Arrival date & time: 03/07/24     Chief Complaint   Emesis   History of Present Illness   Brett Gray is a 78 y.o. year-old male with a history of hyperlipidemia presenting to the ED with chief complaint of emesis.  Patient explains he began having some nausea and vomiting while at a picnic earlier today.  Thought maybe he ate something that was not agreeing with him.  Then began having some right arm pain.  Wanted to come in to make sure his heart was okay.  Currently without discomfort, minimal nausea at this time.  Denies chest pain or shortness of breath, no other complaints.  History of [place] aortic transection status post repair but does not have a history of CAD.  Review of Systems  A thorough review of systems was obtained and all systems are negative except as noted in the HPI and PMH.   Patient's Health History    Past Medical History:  Diagnosis Date   Arthritis    Hernia, inguinal, bilateral    Hyperlipidemia    Hypothyroidism     Past Surgical History:  Procedure Laterality Date   Aortic catheter     percutaneous endovascular repair of thoracic aortic transection   BACK SURGERY     CHOLECYSTECTOMY  09/20/2012   Procedure: LAPAROSCOPIC CHOLECYSTECTOMY;  Surgeon: Beau Bound, MD;  Location: [facility] ORS;  Service: General;  Laterality: N/A;   COLONOSCOPY N/A 06/26/2014   Procedure: COLONOSCOPY;  Surgeon: Alyce Jubilee, MD;  Location: [facility] ENDO SUITE;  Service: Endoscopy;  Laterality: N/A;  1115-rescheduled to 1015 Leigh Ann notified pt   FRACTURE SURGERY     INGUINAL HERNIA REPAIR  10/18/2000   bilateral   SPINE SURGERY     WRIST FRACTURE SURGERY     YAG LASER APPLICATION Left 02/22/2017   Procedure: YAG LASER APPLICATION;  Surgeon: Albert Huff, MD;  Location: [facility] ORS;  Service: Ophthalmology;  Laterality: Left;    Family History  Problem Relation Age of Onset   Heart  disease Mother    Other Mother        Carotid artery   Diabetes Mother    Cancer Mother    Deep vein thrombosis Father    Cancer Sister        gastric   Breast cancer Sister    Breast cancer Sister    Heart disease Brother    Cancer Brother    Skin cancer Brother    Colon cancer Neg Hx     Social History   Socioeconomic History   Marital status: Married    Spouse name: Not on file   Number of children: 4   Years of education: Not on file   Highest education level: Not on file  Occupational History   Occupation: [employer] doctor  Tobacco Use   Smoking status: Never   Smokeless tobacco: Never   Tobacco comments:    Never smoked  Vaping Use   Vaping status: Never Used  Substance and Sexual Activity   Alcohol use: No    Alcohol/week: 0.0 standard drinks of alcohol   Drug use: No   Sexual activity: Yes    Birth control/protection: Post-menopausal  Other Topics Concern   Not on file  Social History Narrative   Not on file   Social Drivers of Health   Financial Resource Strain: Not on file  Food Insecurity: Not on file  Transportation Needs: Not on file  Physical Activity: Not on file  Stress: Not on file  Social Connections: Not on file  Intimate Partner Violence: Not on file     Physical Exam   Vitals:   03/06/24 2300 03/07/24 0200  BP: (!) 160/83 (!) 146/84  Pulse: 79 63  Resp: 20 18  Temp: 99.3 F (37.4 C) 99 F (37.2 C)  SpO2: 95% 94%    CONSTITUTIONAL: Well-appearing, NAD NEURO/PSYCH:  Alert and oriented x 3, no focal deficits EYES:  eyes equal and reactive ENT/NECK:  no LAD, no JVD CARDIO: Regular rate, well-perfused, normal S1 and S2 PULM:  CTAB no wheezing or rhonchi GI/GU:  non-distended, non-tender MSK/SPINE:  No gross deformities, no edema SKIN:  no rash, atraumatic   *Additional and/or pertinent findings included in MDM below  Diagnostic and Interventional Summary    EKG Interpretation Date/Time:  Tuesday Mar 06 2024 22:54:18  EDT Ventricular Rate:  81 PR Interval:  130 QRS Duration:  90 QT Interval:  360 QTC Calculation: 418 R Axis:   48  Text Interpretation: Normal sinus rhythm Nonspecific T wave abnormality Abnormal ECG When compared with ECG of 04-Nov-2023 16:04, No significant change was found Confirmed by Gwenetta Lennert [phone]) on 03/07/2024 1:45:26 AM       Labs Reviewed  COMPREHENSIVE METABOLIC PANEL WITH GFR - Abnormal; Notable for the following components:      Result Value   Glucose, Bld 126 (*)    Calcium 8.5 (*)    All other components within normal limits  URINALYSIS, ROUTINE W REFLEX MICROSCOPIC - Abnormal; Notable for the following components:   Protein, ur 30 (*)    All other components within normal limits  LIPASE, BLOOD  CBC  TROPONIN I (HIGH SENSITIVITY)  TROPONIN I (HIGH SENSITIVITY)    No orders to display    Medications - No data to display   Procedures  /  Critical Care Procedures  ED Course and Medical Decision Making  Initial Impression and Ddx Atypical presentation of ACS is considered.  Could be GI or MSK.  EKG is unchanged awaiting labs.  Past medical/surgical history that increases complexity of ED encounter: Aortic transection status post repair  Interpretation of Diagnostics I personally reviewed the EKG and my interpretation is as follows: Sinus rhythm without concerning ischemic findings  No significant blood count or electrolyte disturbance.  Troponin negative x 2  Patient Reassessment and Ultimate Disposition/Management     Patient continues to look and feel well and would like to go home, symptoms unlikely to be cardiac in nature, appropriate for discharge.  Patient management required discussion with the following services or consulting groups:  None  Complexity of Problems Addressed Acute illness or injury that poses threat of life of bodily function  Additional Data Reviewed and Analyzed Further history obtained from: Prior labs/imaging  results  Additional Factors Impacting ED Encounter Risk Consideration of hospitalization  Merrick Abe. Harless Lien, MD [facility] Emergency Medicine [facility] [email or web address]  Final Clinical Impressions(s) / ED Diagnoses     ICD-10-CM   1. Nausea and vomiting, unspecified vomiting type  R11.2     2. Pain of right upper extremity  M79.601       ED Discharge Orders          Ordered    ondansetron  (ZOFRAN -ODT) 4 MG disintegrating tablet  Every 8 hours PRN        03/07/24 0304  Discharge Instructions Discussed with and Provided to Patient:     Discharge Instructions      You were evaluated in the Emergency Department and after careful evaluation, we did not find any emergent condition requiring admission or further testing in the hospital.  Your exam/testing today is overall reassuring.  No signs of heart strain or damage.  Can use Zofran  at home as needed for nausea.  Follow-up with your primary care doctor to discuss your symptoms.  Please return to the Emergency Department if you experience any worsening of your condition.   Thank you for allowing us  to be a part of your care.     Edson Graces, MD 03/07/24 [phone]

## 2024-03-08 DIAGNOSIS — Z6829 Body mass index (BMI) 29.0-29.9, adult: Secondary | ICD-10-CM | POA: Diagnosis not present

## 2024-03-08 DIAGNOSIS — M25511 Pain in right shoulder: Secondary | ICD-10-CM | POA: Diagnosis not present

## 2024-03-08 DIAGNOSIS — E663 Overweight: Secondary | ICD-10-CM | POA: Diagnosis not present

## 2024-03-13 DIAGNOSIS — R809 Proteinuria, unspecified: Secondary | ICD-10-CM | POA: Diagnosis not present

## 2024-03-13 DIAGNOSIS — E211 Secondary hyperparathyroidism, not elsewhere classified: Secondary | ICD-10-CM | POA: Diagnosis not present

## 2024-03-13 DIAGNOSIS — D631 Anemia in chronic kidney disease: Secondary | ICD-10-CM | POA: Diagnosis not present

## 2024-03-13 DIAGNOSIS — N189 Chronic kidney disease, unspecified: Secondary | ICD-10-CM | POA: Diagnosis not present

## 2024-03-26 DIAGNOSIS — I129 Hypertensive chronic kidney disease with stage 1 through stage 4 chronic kidney disease, or unspecified chronic kidney disease: Secondary | ICD-10-CM | POA: Diagnosis not present

## 2024-03-26 DIAGNOSIS — N182 Chronic kidney disease, stage 2 (mild): Secondary | ICD-10-CM | POA: Diagnosis not present

## 2024-03-26 DIAGNOSIS — D638 Anemia in other chronic diseases classified elsewhere: Secondary | ICD-10-CM | POA: Diagnosis not present

## 2024-04-18 ENCOUNTER — Ambulatory Visit: Payer: Self-pay | Admitting: *Deleted

## 2024-05-24 ENCOUNTER — Encounter (INDEPENDENT_AMBULATORY_CARE_PROVIDER_SITE_OTHER): Payer: Self-pay | Admitting: *Deleted

## 2024-06-06 DIAGNOSIS — L821 Other seborrheic keratosis: Secondary | ICD-10-CM | POA: Diagnosis not present

## 2024-07-11 DIAGNOSIS — M1712 Unilateral primary osteoarthritis, left knee: Secondary | ICD-10-CM | POA: Diagnosis not present

## 2024-07-11 DIAGNOSIS — M25662 Stiffness of left knee, not elsewhere classified: Secondary | ICD-10-CM | POA: Diagnosis not present

## 2024-07-11 DIAGNOSIS — R262 Difficulty in walking, not elsewhere classified: Secondary | ICD-10-CM | POA: Diagnosis not present

## 2024-07-11 DIAGNOSIS — M25562 Pain in left knee: Secondary | ICD-10-CM | POA: Diagnosis not present

## 2024-08-14 DIAGNOSIS — M1712 Unilateral primary osteoarthritis, left knee: Secondary | ICD-10-CM | POA: Diagnosis not present

## 2024-08-28 DIAGNOSIS — N1831 Chronic kidney disease, stage 3a: Secondary | ICD-10-CM | POA: Diagnosis not present

## 2024-08-28 DIAGNOSIS — E039 Hypothyroidism, unspecified: Secondary | ICD-10-CM | POA: Diagnosis not present

## 2024-08-28 DIAGNOSIS — E785 Hyperlipidemia, unspecified: Secondary | ICD-10-CM | POA: Diagnosis not present

## 2024-08-28 DIAGNOSIS — E538 Deficiency of other specified B group vitamins: Secondary | ICD-10-CM | POA: Diagnosis not present

## 2024-08-28 DIAGNOSIS — Z23 Encounter for immunization: Secondary | ICD-10-CM | POA: Diagnosis not present

## 2024-08-28 DIAGNOSIS — R7309 Other abnormal glucose: Secondary | ICD-10-CM | POA: Diagnosis not present

## 2024-08-28 DIAGNOSIS — Z125 Encounter for screening for malignant neoplasm of prostate: Secondary | ICD-10-CM | POA: Diagnosis not present

## 2024-08-28 DIAGNOSIS — E559 Vitamin D deficiency, unspecified: Secondary | ICD-10-CM | POA: Diagnosis not present

## 2024-09-19 DIAGNOSIS — R809 Proteinuria, unspecified: Secondary | ICD-10-CM | POA: Diagnosis not present

## 2024-09-19 DIAGNOSIS — N189 Chronic kidney disease, unspecified: Secondary | ICD-10-CM | POA: Diagnosis not present

## 2024-09-19 DIAGNOSIS — E119 Type 2 diabetes mellitus without complications: Secondary | ICD-10-CM | POA: Diagnosis not present

## 2024-09-19 DIAGNOSIS — I10 Essential (primary) hypertension: Secondary | ICD-10-CM | POA: Diagnosis not present

## 2024-09-19 DIAGNOSIS — E611 Iron deficiency: Secondary | ICD-10-CM | POA: Diagnosis not present

## 2024-09-19 DIAGNOSIS — D7589 Other specified diseases of blood and blood-forming organs: Secondary | ICD-10-CM | POA: Diagnosis not present

## 2024-09-19 DIAGNOSIS — E559 Vitamin D deficiency, unspecified: Secondary | ICD-10-CM | POA: Diagnosis not present

## 2024-09-19 DIAGNOSIS — D631 Anemia in chronic kidney disease: Secondary | ICD-10-CM | POA: Diagnosis not present

## 2024-09-26 DIAGNOSIS — D638 Anemia in other chronic diseases classified elsewhere: Secondary | ICD-10-CM | POA: Diagnosis not present

## 2024-09-26 DIAGNOSIS — N1831 Chronic kidney disease, stage 3a: Secondary | ICD-10-CM | POA: Diagnosis not present

## 2024-09-26 DIAGNOSIS — I129 Hypertensive chronic kidney disease with stage 1 through stage 4 chronic kidney disease, or unspecified chronic kidney disease: Secondary | ICD-10-CM | POA: Diagnosis not present

## 2024-09-26 DIAGNOSIS — R7303 Prediabetes: Secondary | ICD-10-CM | POA: Diagnosis not present

## 2024-11-23 NOTE — Progress Notes (Unsigned)
 "   Cardiology Clinic Note   Patient Name: Brett Gray Date of Encounter: 11/23/2024  Primary Care Provider:  Marvine Rush, MD Primary Cardiologist:  Darryle ONEIDA Decent, MD  Patient Profile    ***  Past Medical History    Past Medical History:  Diagnosis Date   Arthritis    Hernia, inguinal, bilateral    Hyperlipidemia    Hypothyroidism    Past Surgical History:  Procedure Laterality Date   Aortic catheter     percutaneous endovascular repair of thoracic aortic transection   BACK SURGERY     CHOLECYSTECTOMY  09/20/2012   Procedure: LAPAROSCOPIC CHOLECYSTECTOMY;  Surgeon: Oneil DELENA Budge, MD;  Location: [facility] ORS;  Service: General;  Laterality: N/A;   COLONOSCOPY N/A 06/26/2014   Procedure: COLONOSCOPY;  Surgeon: Margo LITTIE Haddock, MD;  Location: [facility] ENDO SUITE;  Service: Endoscopy;  Laterality: N/A;  1115-rescheduled to 1015 Leigh Ann notified pt   FRACTURE SURGERY     INGUINAL HERNIA REPAIR  10/18/2000   bilateral   SPINE SURGERY     WRIST FRACTURE SURGERY     YAG LASER APPLICATION Left 02/22/2017   Procedure: YAG LASER APPLICATION;  Surgeon: Roz Oneil, MD;  Location: [facility] ORS;  Service: Ophthalmology;  Laterality: Left;    Allergies  Allergies[1]  History of Present Illness    ***  Home Medications    Prior to Admission medications  Medication Sig Start Date End Date Taking? Authorizing Provider  aspirin 81 MG tablet Take 81 mg by mouth daily.      [provider]  Cholecalciferol (VITAMIN D-3 PO) Take 1 Capful by mouth daily.    [provider]  levothyroxine  (SYNTHROID , LEVOTHROID) 100 MCG tablet Take 125 mcg by mouth daily.  07/18/11   [provider]  Multiple Vitamin (MULTIVITAMIN WITH MINERALS) TABS Take 1 tablet by mouth daily.    [provider]  NON FORMULARY Ducosate sodium  100 mg     Takes 2 tablets daily    [provider]  NON FORMULARY Phillips tablets    One tablet daily    [provider]   Omega-3 Fatty Acids (FISH OIL PO) Take 1 Capful by mouth daily.    [provider]  ondansetron  (ZOFRAN -ODT) 4 MG disintegrating tablet Take 1 tablet (4 mg total) by mouth every 8 (eight) hours as needed for nausea or vomiting. 03/07/24   Theadore Ozell CHRISTELLA, MD  simvastatin (ZOCOR) 40 MG tablet Take 1 tablet by mouth daily.  08/06/11   [provider]  sodium bicarbonate 650 MG tablet Take 650 mg by mouth 3 (three) times daily. 09/09/21   [provider]    Family History    Family History  Problem Relation Age of Onset   Heart disease Mother    Other Mother        Carotid artery   Diabetes Mother    Cancer Mother    Deep vein thrombosis Father    Cancer Sister        gastric   Breast cancer Sister    Breast cancer Sister    Heart disease Brother    Cancer Brother    Skin cancer Brother    Colon cancer Neg Hx    He indicated that his mother is deceased. He indicated that his father is deceased. He indicated that one of his three sisters is deceased. He indicated that only one of his two brothers is alive. He indicated that the status  of his neg hx is unknown.  Social History    Social History   Socioeconomic History   Marital status: Married    Spouse name: Not on file   Number of children: 4   Years of education: Not on file   Highest education level: Not on file  Occupational History   Occupation: [employer] Doctor  Tobacco Use   Smoking status: Never   Smokeless tobacco: Never   Tobacco comments:    Never smoked  Vaping Use   Vaping status: Never Used  Substance and Sexual Activity   Alcohol use: No    Alcohol/week: 0.0 standard drinks of alcohol   Drug use: No   Sexual activity: Yes    Birth control/protection: Post-menopausal  Other Topics Concern   Not on file  Social History Narrative   Not on file   Social Drivers of Health   Tobacco Use: Low Risk (03/06/2024)   Patient History    Smoking Tobacco Use: Never    Smokeless Tobacco  Use: Never    Passive Exposure: Not on file  Financial Resource Strain: Not on file  Food Insecurity: Not on file  Transportation Needs: Not on file  Physical Activity: Not on file  Stress: Not on file  Social Connections: Not on file  Intimate Partner Violence: Not on file  Depression (EYV7-0): Not on file  Alcohol Screen: Not on file  Housing: Not on file  Utilities: Not on file  Health Literacy: Not on file     Review of Systems    General:  No chills, fever, night sweats or weight changes.  Cardiovascular:  No chest pain, dyspnea on exertion, edema, orthopnea, palpitations, paroxysmal nocturnal dyspnea. Dermatological: No rash, lesions/masses Respiratory: No cough, dyspnea Urologic: No hematuria, dysuria Abdominal:   No nausea, vomiting, diarrhea, bright red blood per rectum, melena, or hematemesis Neurologic:  No visual changes, wkns, changes in mental status. All other systems reviewed and are otherwise negative except as noted above.  Physical Exam    VS:  There were no vitals taken for this visit. , BMI There is no height or weight on file to calculate BMI. GEN: Well nourished, well developed, in no acute distress. HEENT: normal. Neck: Supple, no JVD, carotid bruits, or masses. Cardiac: RRR, no murmurs, rubs, or gallops. No clubbing, cyanosis, edema.  Radials/DP/PT 2+ and equal bilaterally.  Respiratory:  Respirations regular and unlabored, clear to auscultation bilaterally. GI: Soft, nontender, nondistended, BS + x 4. MS: no deformity or atrophy. Skin: warm and dry, no rash. Neuro:  Strength and sensation are intact. Psych: Normal affect.  Accessory Clinical Findings    Recent Labs: 03/06/2024: ALT 21; BUN 23; Creatinine, Ser 1.12; Hemoglobin 14.3; Platelets 164; Potassium 3.7; Sodium 140   Recent Lipid Panel No results found for: CHOL, TRIG, HDL, CHOLHDL, VLDL, LDLCALC, LDLDIRECT  No BP recorded.  {Refresh Note OR Click here to enter BP  :1}***     ECG personally reviewed by me today- ***          Assessment & Plan   1.  ***   Josefa HERO. Alden Feagan NP-C     11/23/2024, 1:28 PM [facility] [address] [place], [place] [address] Office [phone]    Notice: This dictation was prepared with Dragon dictation along with smaller phrase technology. Any transcriptional errors that result from this process are unintentional and may not be corrected upon review.   I spent***minutes examining this patient, reviewing medications,  and using patient centered shared decision making involving their cardiac care.   I spent  20 minutes reviewing past medical history,  medications, and prior cardiac tests.     [1] No Known Allergies  "

## 2024-11-26 ENCOUNTER — Ambulatory Visit: Admitting: General Practice

## 2024-12-11 ENCOUNTER — Ambulatory Visit: Payer: Self-pay
